# Patient Record
Sex: Female | Born: 1950 | Race: White | Hispanic: No | Smoking: Never smoker
Health system: Southern US, Community
[De-identification: ages and names within clinical notes are randomized; demographics above are authoritative.]

## PROBLEM LIST (undated history)

## (undated) DIAGNOSIS — E669 Obesity, unspecified: Principal | ICD-10-CM

## (undated) DIAGNOSIS — E079 Disorder of thyroid, unspecified: Secondary | ICD-10-CM

## (undated) DIAGNOSIS — T7840XA Allergy, unspecified, initial encounter: Secondary | ICD-10-CM

## (undated) DIAGNOSIS — L659 Nonscarring hair loss, unspecified: Secondary | ICD-10-CM

## (undated) DIAGNOSIS — C801 Malignant (primary) neoplasm, unspecified: Secondary | ICD-10-CM

## (undated) DIAGNOSIS — Z0000 Encounter for general adult medical examination without abnormal findings: Secondary | ICD-10-CM

## (undated) HISTORY — PX: TONSILLECTOMY: SHX5217

## (undated) HISTORY — PX: INNER EAR SURGERY: SHX679

## (undated) HISTORY — DX: Disorder of thyroid, unspecified: E07.9

## (undated) HISTORY — DX: Nonscarring hair loss, unspecified: L65.9

## (undated) HISTORY — DX: Allergy, unspecified, initial encounter: T78.40XA

## (undated) HISTORY — DX: Encounter for general adult medical examination without abnormal findings: Z00.00

## (undated) HISTORY — DX: Obesity, unspecified: E66.9

## (undated) HISTORY — DX: Malignant (primary) neoplasm, unspecified: C80.1

---

## 2009-10-14 ENCOUNTER — Ambulatory Visit: Payer: Self-pay | Admitting: Diagnostic Radiology

## 2009-10-14 ENCOUNTER — Inpatient Hospital Stay (HOSPITAL_COMMUNITY): Admission: AD | Admit: 2009-10-14 | Discharge: 2009-10-17 | Payer: Self-pay | Admitting: Internal Medicine

## 2009-10-14 ENCOUNTER — Ambulatory Visit: Payer: Self-pay | Admitting: Gastroenterology

## 2009-10-14 ENCOUNTER — Encounter: Payer: Self-pay | Admitting: Emergency Medicine

## 2009-10-26 ENCOUNTER — Encounter: Payer: Self-pay | Admitting: Internal Medicine

## 2009-10-26 LAB — HM COLONOSCOPY: HM Colonoscopy: ABNORMAL

## 2009-10-31 ENCOUNTER — Ambulatory Visit: Payer: Self-pay | Admitting: Oncology

## 2009-11-01 ENCOUNTER — Ambulatory Visit: Payer: Self-pay | Admitting: Internal Medicine

## 2009-11-01 DIAGNOSIS — E039 Hypothyroidism, unspecified: Secondary | ICD-10-CM | POA: Insufficient documentation

## 2009-11-01 DIAGNOSIS — E1169 Type 2 diabetes mellitus with other specified complication: Secondary | ICD-10-CM | POA: Insufficient documentation

## 2009-11-01 DIAGNOSIS — E669 Obesity, unspecified: Secondary | ICD-10-CM

## 2009-11-01 DIAGNOSIS — C189 Malignant neoplasm of colon, unspecified: Secondary | ICD-10-CM | POA: Insufficient documentation

## 2009-11-01 LAB — CONVERTED CEMR LAB

## 2009-11-04 ENCOUNTER — Encounter (INDEPENDENT_AMBULATORY_CARE_PROVIDER_SITE_OTHER): Payer: Self-pay | Admitting: General Surgery

## 2009-11-04 ENCOUNTER — Inpatient Hospital Stay (HOSPITAL_COMMUNITY): Admission: RE | Admit: 2009-11-04 | Discharge: 2009-11-08 | Payer: Self-pay | Admitting: General Surgery

## 2009-11-21 ENCOUNTER — Encounter: Payer: Self-pay | Admitting: Internal Medicine

## 2009-11-21 ENCOUNTER — Telehealth: Payer: Self-pay | Admitting: Internal Medicine

## 2009-11-23 ENCOUNTER — Encounter: Payer: Self-pay | Admitting: Internal Medicine

## 2009-12-05 ENCOUNTER — Ambulatory Visit: Payer: Self-pay | Admitting: Oncology

## 2009-12-07 ENCOUNTER — Ambulatory Visit (HOSPITAL_BASED_OUTPATIENT_CLINIC_OR_DEPARTMENT_OTHER): Admission: RE | Admit: 2009-12-07 | Discharge: 2009-12-07 | Payer: Self-pay | Admitting: General Surgery

## 2009-12-08 ENCOUNTER — Ambulatory Visit (HOSPITAL_COMMUNITY): Admission: RE | Admit: 2009-12-08 | Discharge: 2009-12-08 | Payer: Self-pay | Admitting: Oncology

## 2009-12-08 LAB — CBC WITH DIFFERENTIAL/PLATELET
Basophils Absolute: 0 10*3/uL (ref 0.0–0.1)
EOS%: 0.1 % (ref 0.0–7.0)
HGB: 10.6 g/dL — ABNORMAL LOW (ref 11.6–15.9)
MCH: 25.8 pg (ref 25.1–34.0)
MCV: 81.8 fL (ref 79.5–101.0)
MONO%: 9 % (ref 0.0–14.0)
NEUT%: 74.6 % (ref 38.4–76.8)
RDW: 18 % — ABNORMAL HIGH (ref 11.2–14.5)

## 2009-12-08 LAB — COMPREHENSIVE METABOLIC PANEL
ALT: 14 U/L (ref 0–35)
AST: 20 U/L (ref 0–37)
Alkaline Phosphatase: 54 U/L (ref 39–117)
Sodium: 135 mEq/L (ref 135–145)
Total Bilirubin: 0.6 mg/dL (ref 0.3–1.2)
Total Protein: 7.4 g/dL (ref 6.0–8.3)

## 2009-12-12 ENCOUNTER — Encounter: Payer: Self-pay | Admitting: Internal Medicine

## 2009-12-12 LAB — CBC WITH DIFFERENTIAL/PLATELET
BASO%: 0.4 % (ref 0.0–2.0)
HCT: 37 % (ref 34.8–46.6)
LYMPH%: 28.7 % (ref 14.0–49.7)
MCH: 25.9 pg (ref 25.1–34.0)
MCHC: 31.6 g/dL (ref 31.5–36.0)
MONO#: 0.4 10*3/uL (ref 0.1–0.9)
NEUT%: 59.3 % (ref 38.4–76.8)
Platelets: 259 10*3/uL (ref 145–400)

## 2010-01-02 ENCOUNTER — Ambulatory Visit: Payer: Self-pay | Admitting: Oncology

## 2010-01-03 ENCOUNTER — Encounter: Payer: Self-pay | Admitting: Internal Medicine

## 2010-01-03 LAB — COMPREHENSIVE METABOLIC PANEL
Albumin: 3.8 g/dL (ref 3.5–5.2)
CO2: 29 mEq/L (ref 19–32)
Calcium: 9 mg/dL (ref 8.4–10.5)
Glucose, Bld: 201 mg/dL — ABNORMAL HIGH (ref 70–99)
Potassium: 3.9 mEq/L (ref 3.5–5.3)
Sodium: 137 mEq/L (ref 135–145)
Total Protein: 7.1 g/dL (ref 6.0–8.3)

## 2010-01-03 LAB — CBC WITH DIFFERENTIAL/PLATELET
Eosinophils Absolute: 0.1 10*3/uL (ref 0.0–0.5)
HGB: 11.8 g/dL (ref 11.6–15.9)
MONO#: 0.4 10*3/uL (ref 0.1–0.9)
NEUT#: 1.4 10*3/uL — ABNORMAL LOW (ref 1.5–6.5)
Platelets: 263 10*3/uL (ref 145–400)
RBC: 4.24 10*6/uL (ref 3.70–5.45)
RDW: 20.2 % — ABNORMAL HIGH (ref 11.2–14.5)
WBC: 3.1 10*3/uL — ABNORMAL LOW (ref 3.9–10.3)

## 2010-01-04 LAB — RESEARCH LABS

## 2010-01-17 LAB — COMPREHENSIVE METABOLIC PANEL
ALT: 14 U/L (ref 0–35)
CO2: 29 mEq/L (ref 19–32)
Chloride: 100 mEq/L (ref 96–112)
Sodium: 137 mEq/L (ref 135–145)
Total Bilirubin: 0.6 mg/dL (ref 0.3–1.2)
Total Protein: 7.3 g/dL (ref 6.0–8.3)

## 2010-01-17 LAB — CBC WITH DIFFERENTIAL/PLATELET
BASO%: 0.3 % (ref 0.0–2.0)
LYMPH%: 16 % (ref 14.0–49.7)
MCHC: 33.7 g/dL (ref 31.5–36.0)
MONO#: 0.5 10*3/uL (ref 0.1–0.9)
RBC: 4.43 10*6/uL (ref 3.70–5.45)
WBC: 9.9 10*3/uL (ref 3.9–10.3)
lymph#: 1.6 10*3/uL (ref 0.9–3.3)

## 2010-01-18 LAB — RESEARCH LABS

## 2010-01-31 LAB — CBC WITH DIFFERENTIAL/PLATELET
Basophils Absolute: 0 10*3/uL (ref 0.0–0.1)
Eosinophils Absolute: 0.1 10*3/uL (ref 0.0–0.5)
HGB: 10.9 g/dL — ABNORMAL LOW (ref 11.6–15.9)
MCV: 83.8 fL (ref 79.5–101.0)
MONO#: 0.3 10*3/uL (ref 0.1–0.9)
MONO%: 13.6 % (ref 0.0–14.0)
NEUT#: 0.8 10*3/uL — ABNORMAL LOW (ref 1.5–6.5)
Platelets: 148 10*3/uL (ref 145–400)
RDW: 19.7 % — ABNORMAL HIGH (ref 11.2–14.5)
WBC: 2.2 10*3/uL — ABNORMAL LOW (ref 3.9–10.3)

## 2010-01-31 LAB — COMPREHENSIVE METABOLIC PANEL
Albumin: 3.6 g/dL (ref 3.5–5.2)
Alkaline Phosphatase: 87 U/L (ref 39–117)
BUN: 11 mg/dL (ref 6–23)
Glucose, Bld: 170 mg/dL — ABNORMAL HIGH (ref 70–99)
Total Bilirubin: 0.9 mg/dL (ref 0.3–1.2)

## 2010-02-03 ENCOUNTER — Ambulatory Visit: Payer: Self-pay | Admitting: Internal Medicine

## 2010-02-03 ENCOUNTER — Ambulatory Visit: Payer: Self-pay | Admitting: Oncology

## 2010-02-03 LAB — CONVERTED CEMR LAB
Albumin: 4.2 g/dL (ref 3.5–5.2)
Alkaline Phosphatase: 97 units/L (ref 39–117)
BUN: 12 mg/dL (ref 6–23)
Bilirubin, Direct: 0.1 mg/dL (ref 0.0–0.3)
CO2: 26 meq/L (ref 19–32)
Calcium: 9.6 mg/dL (ref 8.4–10.5)
Chloride: 101 meq/L (ref 96–112)
Creatinine, Ser: 0.74 mg/dL (ref 0.40–1.20)
Glucose, Bld: 114 mg/dL — ABNORMAL HIGH (ref 70–99)
HDL: 62 mg/dL (ref >39)
Indirect Bilirubin: 0.4 mg/dL (ref 0.0–0.9)
Total Bilirubin: 0.5 mg/dL (ref 0.3–1.2)

## 2010-02-06 ENCOUNTER — Telehealth: Payer: Self-pay | Admitting: Internal Medicine

## 2010-02-07 LAB — CBC WITH DIFFERENTIAL/PLATELET
Basophils Absolute: 0.1 10*3/uL (ref 0.0–0.1)
Eosinophils Absolute: 0.1 10*3/uL (ref 0.0–0.5)
HCT: 38.2 % (ref 34.8–46.6)
LYMPH%: 47.3 % (ref 14.0–49.7)
MCV: 85.1 fL (ref 79.5–101.0)
MONO#: 0.6 10*3/uL (ref 0.1–0.9)
MONO%: 18.1 % — ABNORMAL HIGH (ref 0.0–14.0)
NEUT#: 1 10*3/uL — ABNORMAL LOW (ref 1.5–6.5)
NEUT%: 29.2 % — ABNORMAL LOW (ref 38.4–76.8)
Platelets: 256 10*3/uL (ref 145–400)
RBC: 4.49 10*6/uL (ref 3.70–5.45)

## 2010-02-14 LAB — CBC WITH DIFFERENTIAL/PLATELET
BASO%: 1 % (ref 0.0–2.0)
EOS%: 1.2 % (ref 0.0–7.0)
HCT: 36.6 % (ref 34.8–46.6)
LYMPH%: 27.4 % (ref 14.0–49.7)
MCH: 29.1 pg (ref 25.1–34.0)
MCHC: 33.6 g/dL (ref 31.5–36.0)
MCV: 86.5 fL (ref 79.5–101.0)
MONO%: 10.3 % (ref 0.0–14.0)
NEUT%: 60.1 % (ref 38.4–76.8)
Platelets: 238 10*3/uL (ref 145–400)
RBC: 4.23 10*6/uL (ref 3.70–5.45)
WBC: 4.6 10*3/uL (ref 3.9–10.3)

## 2010-02-27 ENCOUNTER — Ambulatory Visit: Payer: Self-pay | Admitting: Oncology

## 2010-02-28 LAB — CBC WITH DIFFERENTIAL/PLATELET
Basophils Absolute: 0 10*3/uL (ref 0.0–0.1)
Eosinophils Absolute: 0.2 10*3/uL (ref 0.0–0.5)
HGB: 12.4 g/dL (ref 11.6–15.9)
MONO#: 0.4 10*3/uL (ref 0.1–0.9)
NEUT#: 7.8 10*3/uL — ABNORMAL HIGH (ref 1.5–6.5)
Platelets: 155 10*3/uL (ref 145–400)
RBC: 4.18 10*6/uL (ref 3.70–5.45)
RDW: 21.1 % — ABNORMAL HIGH (ref 11.2–14.5)
WBC: 9.9 10*3/uL (ref 3.9–10.3)

## 2010-02-28 LAB — COMPREHENSIVE METABOLIC PANEL
Albumin: 3.9 g/dL (ref 3.5–5.2)
CO2: 32 mEq/L (ref 19–32)
Calcium: 9.4 mg/dL (ref 8.4–10.5)
Chloride: 101 mEq/L (ref 96–112)
Glucose, Bld: 152 mg/dL — ABNORMAL HIGH (ref 70–99)
Potassium: 3.9 mEq/L (ref 3.5–5.3)
Sodium: 140 mEq/L (ref 135–145)
Total Protein: 7.2 g/dL (ref 6.0–8.3)

## 2010-03-03 ENCOUNTER — Ambulatory Visit: Payer: Self-pay | Admitting: Internal Medicine

## 2010-03-14 LAB — COMPREHENSIVE METABOLIC PANEL
Albumin: 4.4 g/dL (ref 3.5–5.2)
Alkaline Phosphatase: 144 U/L — ABNORMAL HIGH (ref 39–117)
Glucose, Bld: 194 mg/dL — ABNORMAL HIGH (ref 70–99)
Potassium: 3.8 mEq/L (ref 3.5–5.3)
Sodium: 140 mEq/L (ref 135–145)
Total Protein: 7.3 g/dL (ref 6.0–8.3)

## 2010-03-14 LAB — CBC WITH DIFFERENTIAL/PLATELET
Eosinophils Absolute: 0.1 10*3/uL (ref 0.0–0.5)
MCV: 89.7 fL (ref 79.5–101.0)
MONO#: 0.5 10*3/uL (ref 0.1–0.9)
MONO%: 3.3 % (ref 0.0–14.0)
NEUT#: 11.6 10*3/uL — ABNORMAL HIGH (ref 1.5–6.5)
RBC: 4.06 10*6/uL (ref 3.70–5.45)
RDW: 21.6 % — ABNORMAL HIGH (ref 11.2–14.5)
WBC: 13.7 10*3/uL — ABNORMAL HIGH (ref 3.9–10.3)
lymph#: 1.5 10*3/uL (ref 0.9–3.3)

## 2010-03-21 ENCOUNTER — Ambulatory Visit: Payer: Self-pay | Admitting: Genetic Counselor

## 2010-03-28 LAB — CBC WITH DIFFERENTIAL/PLATELET
Basophils Absolute: 0 10*3/uL (ref 0.0–0.1)
EOS%: 0.6 % (ref 0.0–7.0)
HCT: 35.1 % (ref 34.8–46.6)
HGB: 11.5 g/dL — ABNORMAL LOW (ref 11.6–15.9)
LYMPH%: 11.7 % — ABNORMAL LOW (ref 14.0–49.7)
MCH: 29.9 pg (ref 25.1–34.0)
MCV: 91.2 fL (ref 79.5–101.0)
MONO%: 6.3 % (ref 0.0–14.0)
NEUT%: 81.1 % — ABNORMAL HIGH (ref 38.4–76.8)
Platelets: 108 10*3/uL — ABNORMAL LOW (ref 145–400)

## 2010-03-28 LAB — COMPREHENSIVE METABOLIC PANEL
AST: 34 U/L (ref 0–37)
Albumin: 3.7 g/dL (ref 3.5–5.2)
Alkaline Phosphatase: 140 U/L — ABNORMAL HIGH (ref 39–117)
BUN: 9 mg/dL (ref 6–23)
Creatinine, Ser: 0.9 mg/dL (ref 0.40–1.20)
Potassium: 3.9 mEq/L (ref 3.5–5.3)

## 2010-03-30 ENCOUNTER — Ambulatory Visit: Payer: Self-pay | Admitting: Oncology

## 2010-04-11 ENCOUNTER — Encounter: Payer: Self-pay | Admitting: Internal Medicine

## 2010-04-11 LAB — CBC WITH DIFFERENTIAL/PLATELET
BASO%: 0.6 % (ref 0.0–2.0)
Eosinophils Absolute: 0.1 10*3/uL (ref 0.0–0.5)
MCHC: 32.9 g/dL (ref 31.5–36.0)
MONO#: 0.9 10*3/uL (ref 0.1–0.9)
NEUT#: 8.8 10*3/uL — ABNORMAL HIGH (ref 1.5–6.5)
RBC: 3.83 10*6/uL (ref 3.70–5.45)
WBC: 11.5 10*3/uL — ABNORMAL HIGH (ref 3.9–10.3)
lymph#: 1.7 10*3/uL (ref 0.9–3.3)

## 2010-04-11 LAB — COMPREHENSIVE METABOLIC PANEL
ALT: 16 U/L (ref 0–35)
CO2: 28 mEq/L (ref 19–32)
Calcium: 9.2 mg/dL (ref 8.4–10.5)
Chloride: 103 mEq/L (ref 96–112)
Creatinine, Ser: 0.7 mg/dL (ref 0.40–1.20)
Glucose, Bld: 131 mg/dL — ABNORMAL HIGH (ref 70–99)
Sodium: 140 mEq/L (ref 135–145)
Total Protein: 7.2 g/dL (ref 6.0–8.3)

## 2010-04-14 ENCOUNTER — Telehealth: Payer: Self-pay | Admitting: Internal Medicine

## 2010-04-25 ENCOUNTER — Encounter: Payer: Self-pay | Admitting: Internal Medicine

## 2010-04-25 LAB — COMPREHENSIVE METABOLIC PANEL
ALT: 16 U/L (ref 0–35)
AST: 27 U/L (ref 0–37)
Albumin: 3.6 g/dL (ref 3.5–5.2)
Alkaline Phosphatase: 147 U/L — ABNORMAL HIGH (ref 39–117)
Chloride: 103 mEq/L (ref 96–112)
Creatinine, Ser: 0.75 mg/dL (ref 0.40–1.20)
Potassium: 3.5 mEq/L (ref 3.5–5.3)

## 2010-04-25 LAB — CBC WITH DIFFERENTIAL/PLATELET
BASO%: 0.3 % (ref 0.0–2.0)
Basophils Absolute: 0 10*3/uL (ref 0.0–0.1)
Eosinophils Absolute: 0.1 10*3/uL (ref 0.0–0.5)
HCT: 35 % (ref 34.8–46.6)
HGB: 11.7 g/dL (ref 11.6–15.9)
MONO#: 0.5 10*3/uL (ref 0.1–0.9)
NEUT#: 8.5 10*3/uL — ABNORMAL HIGH (ref 1.5–6.5)
NEUT%: 82.5 % — ABNORMAL HIGH (ref 38.4–76.8)
WBC: 10.3 10*3/uL (ref 3.9–10.3)
lymph#: 1.1 10*3/uL (ref 0.9–3.3)

## 2010-04-28 ENCOUNTER — Ambulatory Visit: Payer: Self-pay | Admitting: Internal Medicine

## 2010-04-28 LAB — CONVERTED CEMR LAB
Chloride: 100 meq/L (ref 96–112)
Glucose, Bld: 113 mg/dL — ABNORMAL HIGH (ref 70–99)
Hgb A1c MFr Bld: 6.9 % — ABNORMAL HIGH (ref <5.7)
Potassium: 4.4 meq/L (ref 3.5–5.3)
Sodium: 139 meq/L (ref 135–145)
TSH: 8.051 microintl units/mL — ABNORMAL HIGH (ref 0.350–4.500)

## 2010-05-08 ENCOUNTER — Ambulatory Visit: Payer: Self-pay | Admitting: Oncology

## 2010-05-09 LAB — COMPREHENSIVE METABOLIC PANEL
AST: 29 U/L (ref 0–37)
Albumin: 3.7 g/dL (ref 3.5–5.2)
Alkaline Phosphatase: 142 U/L — ABNORMAL HIGH (ref 39–117)
BUN: 10 mg/dL (ref 6–23)
Glucose, Bld: 158 mg/dL — ABNORMAL HIGH (ref 70–99)
Potassium: 3.7 mEq/L (ref 3.5–5.3)
Sodium: 138 mEq/L (ref 135–145)
Total Bilirubin: 0.7 mg/dL (ref 0.3–1.2)

## 2010-05-09 LAB — CBC WITH DIFFERENTIAL/PLATELET
Basophils Absolute: 0 10*3/uL (ref 0.0–0.1)
EOS%: 1.5 % (ref 0.0–7.0)
Eosinophils Absolute: 0.2 10*3/uL (ref 0.0–0.5)
HGB: 11.7 g/dL (ref 11.6–15.9)
LYMPH%: 14.3 % (ref 14.0–49.7)
MCH: 32.2 pg (ref 25.1–34.0)
MCV: 97.8 fL (ref 79.5–101.0)
MONO%: 8.1 % (ref 0.0–14.0)
NEUT#: 8.1 10*3/uL — ABNORMAL HIGH (ref 1.5–6.5)
NEUT%: 75.7 % (ref 38.4–76.8)
Platelets: 100 10*3/uL — ABNORMAL LOW (ref 145–400)
RDW: 18.4 % — ABNORMAL HIGH (ref 11.2–14.5)

## 2010-05-12 ENCOUNTER — Ambulatory Visit: Payer: Self-pay | Admitting: Internal Medicine

## 2010-05-19 ENCOUNTER — Ambulatory Visit: Payer: Self-pay | Admitting: Internal Medicine

## 2010-05-19 DIAGNOSIS — L255 Unspecified contact dermatitis due to plants, except food: Secondary | ICD-10-CM | POA: Insufficient documentation

## 2010-05-23 LAB — CBC WITH DIFFERENTIAL/PLATELET
Basophils Absolute: 0 10*3/uL (ref 0.0–0.1)
Eosinophils Absolute: 0.1 10*3/uL (ref 0.0–0.5)
HGB: 11.4 g/dL — ABNORMAL LOW (ref 11.6–15.9)
LYMPH%: 37.1 % (ref 14.0–49.7)
MCV: 97.1 fL (ref 79.5–101.0)
MONO#: 0.4 10*3/uL (ref 0.1–0.9)
MONO%: 14.6 % — ABNORMAL HIGH (ref 0.0–14.0)
NEUT#: 1.3 10*3/uL — ABNORMAL LOW (ref 1.5–6.5)
Platelets: 133 10*3/uL — ABNORMAL LOW (ref 145–400)
RBC: 3.49 10*6/uL — ABNORMAL LOW (ref 3.70–5.45)
WBC: 2.9 10*3/uL — ABNORMAL LOW (ref 3.9–10.3)
nRBC: 0 % (ref 0–0)

## 2010-05-23 LAB — COMPREHENSIVE METABOLIC PANEL
Alkaline Phosphatase: 98 U/L (ref 39–117)
BUN: 12 mg/dL (ref 6–23)
CO2: 29 mEq/L (ref 19–32)
Creatinine, Ser: 0.72 mg/dL (ref 0.40–1.20)
Glucose, Bld: 158 mg/dL — ABNORMAL HIGH (ref 70–99)
Sodium: 140 mEq/L (ref 135–145)
Total Bilirubin: 1 mg/dL (ref 0.3–1.2)
Total Protein: 6.7 g/dL (ref 6.0–8.3)

## 2010-06-05 LAB — COMPREHENSIVE METABOLIC PANEL
ALT: 17 U/L (ref 0–35)
Albumin: 4 g/dL (ref 3.5–5.2)
Alkaline Phosphatase: 92 U/L (ref 39–117)
CO2: 27 mEq/L (ref 19–32)
Glucose, Bld: 130 mg/dL — ABNORMAL HIGH (ref 70–99)
Potassium: 3.9 mEq/L (ref 3.5–5.3)
Sodium: 139 mEq/L (ref 135–145)
Total Protein: 7.1 g/dL (ref 6.0–8.3)

## 2010-06-05 LAB — CBC WITH DIFFERENTIAL/PLATELET
BASO%: 1.2 % (ref 0.0–2.0)
Basophils Absolute: 0 10*3/uL (ref 0.0–0.1)
EOS%: 3.2 % (ref 0.0–7.0)
Eosinophils Absolute: 0.1 10*3/uL (ref 0.0–0.5)
HCT: 32.4 % — ABNORMAL LOW (ref 34.8–46.6)
HGB: 11.4 g/dL — ABNORMAL LOW (ref 11.6–15.9)
LYMPH%: 36.8 % (ref 14.0–49.7)
MCH: 34.7 pg — ABNORMAL HIGH (ref 25.1–34.0)
MCHC: 35.1 g/dL (ref 31.5–36.0)
MCV: 98.9 fL (ref 79.5–101.0)
MONO#: 0.5 10*3/uL (ref 0.1–0.9)
MONO%: 15.8 % — ABNORMAL HIGH (ref 0.0–14.0)
NEUT#: 1.3 10*3/uL — ABNORMAL LOW (ref 1.5–6.5)
NEUT%: 43 % (ref 38.4–76.8)
Platelets: 196 10*3/uL (ref 145–400)
RBC: 3.28 10*6/uL — ABNORMAL LOW (ref 3.70–5.45)
RDW: 17.6 % — ABNORMAL HIGH (ref 11.2–14.5)
WBC: 3 10*3/uL — ABNORMAL LOW (ref 3.9–10.3)
lymph#: 1.1 10*3/uL (ref 0.9–3.3)

## 2010-06-07 ENCOUNTER — Ambulatory Visit: Payer: Self-pay | Admitting: Oncology

## 2010-07-13 ENCOUNTER — Ambulatory Visit: Payer: Self-pay | Admitting: Oncology

## 2010-07-17 ENCOUNTER — Encounter: Payer: Self-pay | Admitting: Internal Medicine

## 2010-07-17 LAB — CBC WITH DIFFERENTIAL/PLATELET
BASO%: 0.7 % (ref 0.0–2.0)
EOS%: 2.6 % (ref 0.0–7.0)
MCH: 33.9 pg (ref 25.1–34.0)
MCHC: 34.9 g/dL (ref 31.5–36.0)
MCV: 97.1 fL (ref 79.5–101.0)
MONO%: 10.7 % (ref 0.0–14.0)
NEUT#: 2.3 10*3/uL (ref 1.5–6.5)
RBC: 3.65 10*6/uL — ABNORMAL LOW (ref 3.70–5.45)
RDW: 13.4 % (ref 11.2–14.5)

## 2010-08-17 ENCOUNTER — Ambulatory Visit: Payer: Self-pay | Admitting: Internal Medicine

## 2010-08-24 ENCOUNTER — Ambulatory Visit: Payer: Self-pay | Admitting: Oncology

## 2010-08-28 LAB — BASIC METABOLIC PANEL
BUN: 12 mg/dL (ref 6–23)
CO2: 29 mEq/L (ref 19–32)
Calcium: 9.2 mg/dL (ref 8.4–10.5)
Creatinine, Ser: 0.64 mg/dL (ref 0.40–1.20)

## 2010-10-02 ENCOUNTER — Ambulatory Visit (HOSPITAL_COMMUNITY): Admission: RE | Admit: 2010-10-02 | Discharge: 2010-10-02 | Payer: Self-pay | Admitting: Oncology

## 2010-10-12 ENCOUNTER — Ambulatory Visit: Payer: Self-pay | Admitting: Oncology

## 2010-10-16 ENCOUNTER — Encounter: Payer: Self-pay | Admitting: Internal Medicine

## 2010-11-22 ENCOUNTER — Ambulatory Visit (HOSPITAL_BASED_OUTPATIENT_CLINIC_OR_DEPARTMENT_OTHER): Admission: RE | Admit: 2010-11-22 | Discharge: 2010-11-22 | Payer: Self-pay | Admitting: General Surgery

## 2010-12-05 ENCOUNTER — Ambulatory Visit: Payer: Self-pay | Admitting: Internal Medicine

## 2010-12-05 LAB — HM MAMMOGRAPHY

## 2011-01-19 ENCOUNTER — Ambulatory Visit (HOSPITAL_COMMUNITY): Admission: RE | Admit: 2011-01-19 | Payer: Self-pay | Source: Home / Self Care | Admitting: Oncology

## 2011-01-30 NOTE — Progress Notes (Signed)
  Phone Note Outgoing Call   Summary of Call: call pt -  blood test shows she needs higher dose of thyroid replacement.   It also shows diabetes is uncontrolled.  I suggest she start metformin -  see rx.  I sugget earlier f/u appt within 1 month Initial call taken by: D. Thomos Lemons DO,  February 06, 2010 2:28 PM    New/Updated Medications: LEVOTHYROXINE SODIUM 50 MCG TABS (LEVOTHYROXINE SODIUM) one by mouth once daily METFORMIN HCL 500 MG TABS (METFORMIN HCL) one half tab by mouth two times a day x 1 week, then one by mouth two times a day Prescriptions: METFORMIN HCL 500 MG TABS (METFORMIN HCL) one half tab by mouth two times a day x 1 week, then one by mouth two times a day  #60 x 1   Entered and Authorized by:   D. Thomos Lemons DO   Signed by:   D. Thomos Lemons DO on 02/06/2010   Method used:   Electronically to        [facility] (retail)       [phone] [address]       [place], [place]  [address]       Ph: [phone]       Fax: [phone]   RxID:   [id number] LEVOTHYROXINE SODIUM 50 MCG TABS (LEVOTHYROXINE SODIUM) one by mouth once daily  #30 x 1   Entered and Authorized by:   D. Thomos Lemons DO   Signed by:   D. Thomos Lemons DO on 02/06/2010   Method used:   Electronically to        [facility] (retail)       [phone] [address]. [address]       [place], [place]  [address]       Ph: [phone]       Fax: [phone]   RxID:   [id number]

## 2011-01-30 NOTE — Assessment & Plan Note (Signed)
Summary: med refill/mhf   Vital Signs:  Patient profile:   60 year old female Height:      63 inches Weight:      207.50 pounds BMI:     36.89 O2 Sat:      99 % on Room air Temp:     98.1 degrees F oral Pulse rate:   70 / minute Resp:     16 per minute BP sitting:   120 / 72  (right arm) Cuff size:   large  Vitals Entered By: Glendell Docker CMA (December 05, 2010 4:22 PM)  O2 Flow:  Room air  Contraindications/Deferment of Procedures/Staging:    Test/Procedure: Mammogram    Reason for deferment: patient declined  CC: Medication Refill Is Patient Diabetic? Yes Did you bring your meter with you today? No Pain Assessment Patient in pain? no      Comments medication refill on Thyroid, last dose was 3 weeks ago, patient states she stopped taking Metformin, but can not remember when, she has not been checking her blood sugars     Last PAP Result Declined   Primary Care Provider:  Dondra Spry DO  CC:  Medication Refill.  History of Present Illness: 60 y/o white female with hx of colon ca, DM II and hypothyroidism for f/u pt stopped metformin poor dietary compliance not checking her blood sugars she reports financial difficulty   she ran out of thyroid medication she was instructed to f/u for blood work but never returned  Preventive Screening-Counseling & Management  Alcohol-Tobacco     Smoking Status: never  Allergies: 1)  ! Codeine  Past History:  Past Surgical History: Caesarean section x3 Tonsillectomy   Right Ear surgery due to numerous ear infections    Social History: Occupation:- Youth worker for local school   Widow/Widower 3 children    Smoking Status:  never  Physical Exam  General:  alert, well-developed, and well-nourished.   Lungs:  normal respiratory effort and normal breath sounds.   Heart:  normal rate, regular rhythm, no murmur, and no gallop.   Extremities:  trace left pedal edema and trace right pedal edema.      Impression & Recommendations:  Problem # 1:  DIABETES-TYPE 2 (ICD-250.00)  pt non compliant with medication.  she is not checking her blood sugars poor dietary compliance restart metformin We discussed risks assoc with untreated diabetes  The following medications were removed from the medication list:    Metformin Hcl 500 Mg Tabs (Metformin hcl) ..... One half tab by mouth two times a day x 1 week, then one by mouth two times a day Her updated medication list for this problem includes:    Metformin Hcl 500 Mg Tabs (Metformin hcl) ..... One by mouth bid  Orders: T-Basic Metabolic Panel [phone]) T- Hemoglobin A1C ([id number]) T-Urine Microalbumin w/creat. ratio [id number]) T-Lipid Profile [id number]) T-Hepatic Function [phone])  Labs Reviewed: Creat: 0.72 (04/28/2010)    Reviewed HgBA1c results: 6.9 (04/28/2010)  7.7 (02/03/2010)  Problem # 2:  HYPOTHYROIDISM (ICD-244.9) pt not coming in for required blood tests I explained to pt that TSH needs to be monitored for propor medication adjustment  Her updated medication list for this problem includes:    Levothyroxine Sodium 88 Mcg Tabs (Levothyroxine sodium) ..... One by mouth once daily  Orders: T-TSH ([id number])  Problem # 3:  COLON CANCER (ICD-153.9) followed by Dr. 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 One by mouth once daily 2)  Centrum Silver Tabs (Multiple vitamins-minerals) .... Take 1 tablet by mouth once a day 3)  Accu-chek Aviva Strp (Glucose blood) .... Test daily in am before meal 4)  Accu-chek Multiclix Lancets Misc (Lancets) .... Use as directed 5)  Metformin Hcl 500 Mg Tabs (Metformin hcl) .... One by mouth bid  Patient  Instructions: 1)  Please schedule a follow-up appointment in 3 months. Prescriptions: METFORMIN HCL 500 MG TABS (METFORMIN HCL) one by mouth bid  #60 x 3   Entered and Authorized by:   D. Thomos Lemons DO   Signed by:   D. Thomos Lemons DO on 12/05/2010   Method used:   Electronically to        [facility] (retail)       [address]       [place], [place]  [address]       Ph: [phone]       Fax: [phone]   RxID:   [id number] LEVOTHYROXINE SODIUM 88 MCG TABS (LEVOTHYROXINE SODIUM) one by mouth once daily  #30 x 3   Entered and Authorized by:   D. Thomos Lemons DO   Signed by:   D. Thomos Lemons DO on 12/05/2010   Method used:   Electronically to        [facility] (retail)       [address]       [place], [place]  [address]       Ph: [phone]       Fax: [phone]   RxID:   [id number] ACCU-CHEK MULTICLIX LANCETS  MISC (LANCETS) use as directed  #100 x 3   Entered and Authorized by:   D. Thomos Lemons DO   Signed by:   D. Thomos Lemons DO on 12/05/2010   Method used:   Electronically to        [facility] (retail)       [address]       [place], [place]  [address]       Ph: [phone]       Fax: [phone]   RxID:   [id number] ACCU-CHEK AVIVA  STRP (GLUCOSE BLOOD) test daily in AM before meal  #100 x 3   Entered and Authorized by:   D. Thomos Lemons DO   Signed by:   D. Thomos Lemons DO on 12/05/2010   Method used:   Electronically to        [facility] (retail)       [address]       [place], [place]  [address]       Ph: [phone]       Fax: [phone]   RxID:   [id number]    Orders Added: 1)  T-Basic Metabolic Panel [id number] 2)  T- Hemoglobin A1C [83036-23375] 3)  T-TSH [51025-85277] 4)  T-Urine Microalbumin w/creat. ratio [82043-[address]] 5)  T-Lipid Profile [80061-22930] 6)  T-Hepatic Function [80076-22960] 7)  Est. Patient Level III [[address]]      Preventive Care Screening  Mammogram:     Date:  12/05/2010    Results:  Declined  Pap Smear:    Date:  12/05/2010    Results:  Declined   Current Allergies (reviewed today): ! CODEINE

## 2011-01-30 NOTE — Assessment & Plan Note (Signed)
Summary: discuss thyroid & Diabetes- jr, resched- jr   Vital Signs:  Patient profile:   60 year old female Weight:      197 pounds BMI:     3.12 O2 Sat:      98 % on Room air Temp:     98.0 degrees F oral Pulse rate:   81 / minute Pulse rhythm:   regular Resp:     18 per minute BP sitting:   108 / 60  (right arm) Cuff size:   large  Vitals Entered By: Glendell Docker CMA (March 03, 2010 3:17 PM)  O2 Flow:  Room air CC: Follow-up visit to discuss lab results Is Patient Diabetic? Yes Comments discuss lab  no concerns, states low blood sugar was 90 and high 115 avg unknown   Primary Care Provider:  Dondra Spry DO  CC:  Follow-up visit to discuss lab results.  History of Present Illness: 60 y/o white female with hx of colon ca, DM II, and hypothyroidism for follow up/ She is still undergoing chemo feels nauseated and dizzy after getting neulasta we called in metformin but she has not started due to GI issues from chemo    Allergies: 1)  ! Codeine  Past History:  Past Medical History: Hypothyroidism DM II Right colon cancer  stage III (T3, N2b)    S/P right hemicolectomy, FOLFOX  - Dr. Myrle Sheng  Past Surgical History: Caesarean section x3 Tonsillectomy  Right Ear surgery due to numerous ear infections   Family History: Family History of Colon CA 1st degree relative <60 (father and brother) Family History Diabetes 1st degree relative Family History Hypertension Family History of Stroke F 1st degree relative <60     Social History: Occupation:- Youth worker for local school   Widow/Widower 3 children    Physical Exam  General:  alert, well-developed, and well-nourished.   Lungs:  normal respiratory effort and normal breath sounds.   Heart:  normal rate, regular rhythm, no murmur, and no gallop.   Extremities:  No lower extremity edema    Impression & Recommendations:  Problem # 1:  DIABETES-TYPE 2 (ICD-250.00) Due to nausea from chemo - hold  off starting metformin.    continue diet control  Her updated medication list for this problem includes:    Metformin Hcl 500 Mg Tabs (Metformin hcl) ..... One half tab by mouth two times a day x 1 week, then one by mouth two times a day  Labs Reviewed: Creat: 0.74 (02/03/2010)    Reviewed HgBA1c results: 7.7 (02/03/2010)  Problem # 2:  HYPOTHYROIDISM (ICD-244.9) thyroid dose increased to 50 micrograms.  repeast TSH in 2 months Her updated medication list for this problem includes:    Levothyroxine Sodium 50 Mcg Tabs (Levothyroxine sodium) ..... One by mouth qd  Labs Reviewed: TSH: 4.872 (02/03/2010)    HgBA1c: 7.7 (02/03/2010) Chol: 278 (02/03/2010)   HDL: 62 (02/03/2010)   LDL: See Comment mg/dL (16/10/9603)   TG: 540 (02/03/2010)  Complete Medication List: 1)  Levothyroxine Sodium 50 Mcg Tabs (Levothyroxine sodium) .... One by mouth qd 2)  Centrum Silver Tabs (Multiple vitamins-minerals) .... Take 1 tablet by mouth once a day 3)  Accu-chek Aviva Strp (Glucose blood) .... Test daily in am before meal 4)  Accu-chek Multiclix Lancet Dev Kit (Lancets misc.) .... Use as directed 5)  Metformin Hcl 500 Mg Tabs (Metformin hcl) .... One half tab by mouth two times a day x 1 week, then one by mouth  two times a day  Patient Instructions: 1)  Please schedule a follow-up appointment in 2 months. 2)  BMP prior to visit, ICD-9:  250.02 3)  TSH prior to visit, ICD-9: 244.90 4)  HbgA1C prior to visit, ICD-9: 250.02 5)  Please return for lab work one (1) week before your next appointment.  Prescriptions: LEVOTHYROXINE SODIUM 50 MCG TABS (LEVOTHYROXINE SODIUM) one by mouth qd  #30 x 3   Entered and Authorized by:   D. Thomos Lemons DO   Signed by:   D. Thomos Lemons DO on 03/03/2010   Method used:   Electronically to        [facility] (retail)       [phone] [address]       [place], [place]  [address]       Ph: [phone]       Fax: [phone]   RxID:    [id number] ACCU-CHEK MULTICLIX LANCET DEV  KIT (LANCETS MISC.) use as directed  #1 x 0   Entered and Authorized by:   D. Thomos Lemons DO   Signed by:   D. Thomos Lemons DO on 03/03/2010   Method used:   Electronically to        [facility] (retail)       [phone] [address]       [place], [place]  [address]       Ph: [phone]       Fax: [phone]   RxID:   [id number] LEVOTHYROXINE SODIUM 75 MCG TABS (LEVOTHYROXINE SODIUM) one by mouth at bedtime  #30 x 2   Entered and Authorized by:   D. Thomos Lemons DO   Signed by:   D. Thomos Lemons DO on 03/03/2010   Method used:   Electronically to        [facility] (retail)       [phone] [address]       [place], [place]  [address]       Ph: [phone]       Fax: [phone]   RxID:   [id number]   Current Allergies (reviewed today): ! CODEINE

## 2011-01-30 NOTE — Letter (Signed)
Summary: [facility]  [facility]   Imported By: Lanelle Bal 01/18/2010 11:35:52  _____________________________________________________________________  External Attachment:    Type:   Image     Comment:   External Document

## 2011-01-30 NOTE — Assessment & Plan Note (Signed)
Summary: poison ivy/mhf   Vital Signs:  Patient profile:   60 year old female Weight:      192 pounds BMI:     34.13 O2 Sat:      99 % on Room air Temp:     97.9 degrees F oral Pulse rate:   87 / minute Pulse rhythm:   regular Resp:     18 per minute BP sitting:   122 / 80  (right arm)  Vitals Entered By: Glendell Docker CMA (May 19, 2010 10:18 AM)  O2 Flow:  Room air CC: Rm 2- Rash   Primary Care Provider:  Dondra Spry DO  CC:  Rm 2- Rash.  History of Present Illness:  Rash      This is a 60 year old woman who presents with Rash.  The patient reports macules, but denies pustules and blisters.  The rash is located on the right leg and left leg.  The patient denies the following symptoms: facial swelling and tongue swelling.  The patient reports a history of new topical exposure.  she was recently trimming trees.  onset  sunday  Allergies: 1)  ! Codeine  Past History:  Past Medical History: Hypothyroidism DM II Right colon cancer  stage III (T3, N2b)    S/P right hemicolectomy, FOLFOX  - Dr. Myrle Sheng   Past Surgical History: Caesarean section x3 Tonsillectomy  Right Ear surgery due to numerous ear infections    Physical Exam  General:  alert, well-developed, and well-nourished.   Lungs:  normal respiratory effort and normal breath sounds.   Heart:  normal rate, regular rhythm, no murmur, and no gallop.   Skin:  scattered macular rash on arms and legs   Impression & Recommendations:  Problem # 1:  CONTACT DERMATITIS&OTHER ECZEMA DUE TO PLANTS (ICD-692.6) use topical steroids.  avoid prednisone due to DM II.  Patient advised to call office if symptoms persist or worsen.  Her updated medication list for this problem includes:    Triamcinolone Acetonide 0.5 % Crea (Triamcinolone acetonide) .Marland Kitchen... Apply two times a day x 1 week    Cetirizine Hcl 10 Mg Tabs (Cetirizine hcl) ..... One by mouth once daily  Complete Medication List: 1)  Levothyroxine Sodium 88 Mcg  Tabs (Levothyroxine sodium) .... One by mouth once daily 2)  Centrum Silver Tabs (Multiple vitamins-minerals) .... Take 1 tablet by mouth once a day 3)  Accu-chek Aviva Strp (Glucose blood) .... Test daily in am before meal 4)  Accu-chek Multiclix Lancet Dev Kit (Lancets misc.) .... Use as directed 5)  Metformin Hcl 500 Mg Tabs (Metformin hcl) .... One half tab by mouth two times a day x 1 week, then one by mouth two times a day 6)  Triamcinolone Acetonide 0.5 % Crea (Triamcinolone acetonide) .... Apply two times a day x 1 week 7)  Hydroxyzine Hcl 25 Mg Tabs (Hydroxyzine hcl) .... One by mouth at bedtime 8)  Cetirizine Hcl 10 Mg Tabs (Cetirizine hcl) .... One by mouth once daily  Patient Instructions: 1)  Call our office if your symptoms do not  improve or gets worse. Prescriptions: CETIRIZINE HCL 10 MG TABS (CETIRIZINE HCL) one by mouth once daily  #30 x 0   Entered and Authorized by:   D. Thomos Lemons DO   Signed by:   D. Thomos Lemons DO on 05/19/2010   Method used:   Electronically to        [facility] (retail)  [address]       [place], [place]  [address]       Ph: [phone]       Fax: [phone]   RxID:   [id number] HYDROXYZINE HCL 25 MG TABS (HYDROXYZINE HCL) one by mouth at bedtime  #30 x 0   Entered and Authorized by:   D. Thomos Lemons DO   Signed by:   D. Thomos Lemons DO on 05/19/2010   Method used:   Electronically to        [facility] (retail)       [address]       [place], [place]  [address]       Ph: [phone]       Fax: [phone]   RxID:   [id number] TRIAMCINOLONE ACETONIDE 0.5 % CREA (TRIAMCINOLONE ACETONIDE) apply two times a day x 1 week  #30 grams x 1   Entered and Authorized by:   D. Thomos Lemons DO   Signed by:   D. Thomos Lemons DO on 05/19/2010   Method used:   Electronically to        [facility] (retail)       [address]       [place], [place]  [address]       Ph: [phone]       Fax: [phone]    RxID:   [id number]   Current Allergies (reviewed today): ! CODEINE

## 2011-01-30 NOTE — Assessment & Plan Note (Signed)
Summary: med check/mhf   Vital Signs:  Patient profile:   60 year old female Weight:      198 pounds BMI:     35.20 O2 Sat:      99 % on Room air Temp:     97.8 degrees F oral Pulse rate:   66 / minute Pulse rhythm:   regular Resp:     16 per minute BP sitting:   100 / 70  (right arm) Cuff size:   large  Vitals Entered By: Glendell Docker CMA (February 03, 2010 3:23 PM)  O2 Flow:  Room air  Primary Care Provider:  D. Thomos Lemons DO  CC:  Medication Refill and Type 2 diabetes mellitus follow-up.  History of Present Illness: Medication Refill  Type 2 Diabetes Mellitus Follow-Up      This is a 60 year old woman who presents for Type 2 diabetes mellitus follow-up.  The patient denies polyuria and polydipsia.  The patient denies the following symptoms: chest pain.  Since the last visit the patient reports poor dietary compliance, not exercising regularly, and not monitoring blood glucose.    since prev visit, she had colon surgery and started chemo for right sided colon ca  Allergies (verified): 1)  ! Codeine  Past History:  Past Medical History: Hypothyroidism New onset DM II Right colon cancer   Past Surgical History: Caesarean section x3 Tonsillectomy  Right Ear surgery due to numerous ear infections  Family History: Family History of Colon CA 1st degree relative <60 (father and brother) Family History Diabetes 1st degree relative Family History Hypertension Family History of Stroke F 1st degree relative <60   Social History: Occupation:- Youth worker for local school   Widow/Widower 3 children   Physical Exam  General:  alert and overweight-appearing.   Neck:  supple and no masses.  no carotid bruits.   Lungs:  normal respiratory effort and normal breath sounds.   Heart:  normal rate, regular rhythm, no murmur, and no gallop.   Abdomen:  obese, soft, mild RLQ tenderness Extremities:  No lower extremity edema    Impression &  Recommendations:  Problem # 1:  DIABETES-TYPE 2 (ICD-250.00) Pt not checking her blood sugars. we provided glucometer.  we discussed starting metformin.  The following medications were removed from the medication list:    Glimepiride 1 Mg Tabs (Glimepiride) .Marland Kitchen... Take 1 tablet by mouth once a day Her updated medication list for this problem includes:    Metformin Hcl 500 Mg Tabs (Metformin hcl) ..... One half tab by mouth two times a day x 1 week, then one by mouth two times a day  Orders: T-Basic Metabolic Panel [phone]) T-Hepatic Function [phone]) T-Lipid Profile ([phone]) T- Hemoglobin A1C (284[address]-24401) T-Urine Microalbumin w/creat. ratio [phone]) Ophthalmology Referral (Ophthalmology)  Problem # 2:  HYPOTHYROIDISM (ICD-244.9) monitor TFTs Her updated medication list for this problem includes:    Levothyroxine Sodium 50 Mcg Tabs (Levothyroxine sodium) ..... One by mouth once daily  Orders: T-TSH (42595-63875)  Problem # 3:  COLON CANCER (ICD-153.9) Stage III adenoma of cecum (T3, N2b)  s/p right hemicolectomy 11/04/2009 receiving chemo FOLFOX - as per Dr. Truett Perna  Complete Medication List: 1)  Levothyroxine Sodium 50 Mcg Tabs (Levothyroxine sodium) .... One by mouth once daily 2)  Centrum Silver Tabs (Multiple vitamins-minerals) .... Take 1 tablet by mouth once a day 3)  Accu-chek Aviva Strp (Glucose blood) .... Test daily in am before meal 4)  Accu-chek Multiclix Lancets Misc (Lancets) .Marland KitchenMarland KitchenMarland Kitchen  Use daily as directed 5)  Metformin Hcl 500 Mg Tabs (Metformin hcl) .... One half tab by mouth two times a day x 1 week, then one by mouth two times a day  Patient Instructions: 1)  Please schedule a follow-up appointment in 2 months. Prescriptions: ACCU-CHEK MULTICLIX LANCETS  MISC (LANCETS) use daily as directed  #100 x 2   Entered and Authorized by:   D. Thomos Lemons DO   Signed by:   D. Thomos Lemons DO on 02/03/2010   Method used:   Electronically to         [facility] (retail)       [phone] [address]       [place], [place]  [address]       Ph: [phone]       Fax: [phone]   RxID:   [id number] ACCU-CHEK AVIVA  STRP (GLUCOSE BLOOD) test daily in AM before meal  #100 x 2   Entered and Authorized by:   D. Thomos Lemons DO   Signed by:   D. Thomos Lemons DO on 02/03/2010   Method used:   Electronically to        [facility] (retail)       [phone] [address]       [place], [place]  [address]       Ph: [phone]       Fax: [phone]   RxID:   [id number]   Current Allergies (reviewed today): ! CODEINE

## 2011-01-30 NOTE — Letter (Signed)
Summary: [facility]  [facility]   Imported By: Lanelle Bal 11/14/2010 13:19:14  _____________________________________________________________________  External Attachment:    Type:   Image     Comment:   External Document

## 2011-01-30 NOTE — Letter (Signed)
[facility]   Imported By: Lanelle Bal 02/03/2010 12:17:53  _____________________________________________________________________  External Attachment:    Type:   Image     Comment:   External Document

## 2011-01-30 NOTE — Letter (Signed)
Summary: [facility]  [facility]   Imported By: Lanelle Bal 01/18/2010 11:33:07  _____________________________________________________________________  External Attachment:    Type:   Image     Comment:   External Document

## 2011-01-30 NOTE — Progress Notes (Signed)
Summary: Levothyroxine Refill   Phone Note Refill Request Message from:  Fax from Pharmacy on April 14, 2010 9:43 AM  Refills Requested: Medication #1:  LEVOTHYROXINE SODIUM 50 MCG TABS one by mouth qd   Dosage confirmed as above?Dosage Confirmed   Supply Requested: 1 month [facility] [phone] fax [phone] phone  Next Appointment Scheduled: 4.29.11 8AM Initial call taken by: Lannette Donath,  April 14, 2010 9:44 AM  Follow-up for Phone Call        call placed to patient at [phone] female individual stated patient was not available. Message was left for patient to return call for rx clarification.  Refill was sent to [facility] on 3/4 and we have a request from [facility]. I need to know which pharmacy for the rx reuest. Follow-up by: Glendell Docker CMA,  April 14, 2010 12:22 PM  Additional Follow-up for Phone Call Additional follow up Details #1::        patient called back and wants her meds to [facility] Diane Tomerlin  April 14, 2010 1:30 PM    Additional Follow-up for Phone Call Additional follow up Details #2::    ok to refill x 3 Follow-up by: D. Thomos Lemons DO,  April 14, 2010 4:43 PM  Prescriptions: LEVOTHYROXINE SODIUM 50 MCG TABS (LEVOTHYROXINE SODIUM) one by mouth qd  #30 x 3   Entered by:   Mervin Kung CMA   Authorized by:   D. Thomos Lemons DO   Signed by:   Mervin Kung CMA on 04/14/2010   Method used:   Electronically to        [facility] (retail)       [address]       [place], [place]  [address]       Ph: [phone]       Fax: [phone]   RxID:   [id number]

## 2011-01-30 NOTE — Assessment & Plan Note (Signed)
Summary: 2 MONTH FOLLOW UP/MHF [facility] WITH PT/MHF   Vital Signs:  Patient profile:   60 year old female Height:      63 inches Weight:      191 pounds BMI:     33.96 O2 Sat:      99 % on Room air Temp:     97.9 degrees F oral Pulse rate:   69 / minute Pulse rhythm:   regular Resp:     16 per minute BP sitting:   104 / 60  (right arm) Cuff size:   large  Vitals Entered By: Glendell Docker CMA (May 12, 2010 9:32 AM)  O2 Flow:  Room air CC: Rm 3-2 Month Follow up disease management Comments not monitoring blood sugars, patient is not taking the Metformin   Primary Care Provider:  Dondra Spry DO  CC:  Rm 3-2 Month Follow up disease management.  History of Present Illness: 60 y/o white female for f/u 2 more sessions left for colon cancer following diabetic diet - mainly drinking water and diet drinks reviewed diet -  breakfast - 1 piece of toast, egg whites,  sometimes uses peanut butter lunch - thin bread, chicken sandwich dinner - sandwich, stir fried vegetables and meat,     Allergies: 1)  ! Codeine  Past History:  Past Medical History: Hypothyroidism DM II Right colon cancer  stage III (T3, N2b)    S/P right hemicolectomy, FOLFOX  - Dr. Myrle Sheng   Past Surgical History: Caesarean section x3 Tonsillectomy  Right Ear surgery due to numerous ear infections    Family History: Family History of Colon CA 1st degree relative <60 (father and brother) Family History Diabetes 1st degree relative Family History Hypertension Family History of Stroke F 1st degree relative <60      Social History: Occupation:- Youth worker for local school   Widow/Widower 3 children     Physical Exam  General:  alert, well-developed, and well-nourished.   Head:  alopecia Lungs:  normal respiratory effort and normal breath sounds.   Heart:  normal rate, regular rhythm, no murmur, and no gallop.   Extremities:  No lower extremity edema    Impression &  Recommendations:  Problem # 1:  HYPOTHYROIDISM (ICD-244.9) Pt reports taking thyroid replacement regularly.  unclear if chemo therapy affecting thyroid levels  Her updated medication list for this problem includes:    Levothyroxine Sodium 88 Mcg Tabs (Levothyroxine sodium) ..... One by mouth once daily  Labs Reviewed: TSH: 8.051 (04/28/2010)    HgBA1c: 6.9 (04/28/2010) Chol: 278 (02/03/2010)   HDL: 62 (02/03/2010)   LDL: See Comment mg/dL (63/87/5643)   TG: 329 (02/03/2010)  Problem # 2:  DIABETES-TYPE 2 (ICD-250.00) Less issues with nausea from chemo.  start metformin  Her updated medication list for this problem includes:    Metformin Hcl 500 Mg Tabs (Metformin hcl) ..... One half tab by mouth two times a day x 1 week, then one by mouth two times a day  Future Orders: T-Basic Metabolic Panel [id number]) ... 07/26/2010 T-Hepatic Function [id number]) ... 07/26/2010 T-Lipid Profile [id number]) ... 07/26/2010 T- Hemoglobin A1C ([id number]) ... 07/26/2010  Complete Medication List: 1)  Levothyroxine Sodium 88 Mcg Tabs (Levothyroxine sodium) .... One by mouth once daily 2)  Centrum Silver Tabs (Multiple vitamins-minerals) .... Take 1 tablet by mouth once a day 3)  Accu-chek Aviva Strp (Glucose blood) .... Test daily in am before meal 4)  Accu-chek Multiclix Lancet Dev Kit (Lancets misc.) .... Use as  directed 5)  Metformin Hcl 500 Mg Tabs (Metformin hcl) .... One half tab by mouth two times a day x 1 week, then one by mouth two times a day  Patient Instructions: 1)  Please schedule a follow-up appointment in 3 months. 2)  BMP prior to visit, ICD-9: 250.00 3)  Hepatic Panel prior to visit, ICD-9: 250.00 4)  Lipid Panel prior to visit, ICD-9: 250.00 5)  HbgA1C prior to visit, ICD-9: 250.00 6)  Please return for lab work one (1) week before your next appointment.  Prescriptions: METFORMIN HCL 500 MG TABS (METFORMIN HCL) one half tab by mouth two times a day x 1 week, then one  by mouth two times a day  #60 x 3   Entered and Authorized by:   D. Thomos Lemons DO   Signed by:   D. Thomos Lemons DO on 05/12/2010   Method used:   Electronically to        [facility]* (retail)       [address]       [place], [place]  [address]       Ph: [phone]       Fax: [phone]   RxID:   [id number] LEVOTHYROXINE SODIUM 88 MCG TABS (LEVOTHYROXINE SODIUM) one by mouth once daily  #30 x 3   Entered and Authorized by:   D. Thomos Lemons DO   Signed by:   D. Thomos Lemons DO on 05/12/2010   Method used:   Electronically to        [facility]* (retail)       [address]       [place], [place]  [address]       Ph: [phone]       Fax: [phone]   RxID:   [id number]   Current Allergies (reviewed today): ! CODEINE

## 2011-01-30 NOTE — Letter (Signed)
[facility]   Imported By: Lanelle Bal 07/28/2010 13:38:24  _____________________________________________________________________  External Attachment:    Type:   Image     Comment:   External Document

## 2011-01-31 ENCOUNTER — Encounter: Payer: Self-pay | Admitting: Oncology

## 2011-02-05 IMAGING — CT CT CHEST W/ CM
2 of 4 series · 16 of 46 positions shown, 18 images · IV contrast (omnipaque)
Comparison: CT 12/08/2009

CT CHEST

CLINICAL DATA: Colon cancer.  Chemotherapy complete.

CT CHEST, ABDOMEN AND PELVIS WITH CONTRAST
TECHNIQUE: Multidetector CT imaging of the chest, abdomen and
pelvis was performed following the standard protocol during bolus
administration of intravenous contrast.
Contrast: 100 ml Omnipaque 300

[Series 2: cap with st · axial · 0.76mm/px · z∈[+902,+1456]mm · 13 of 123 slices shown, 15 images]
[im 6/123  soft-tissue]
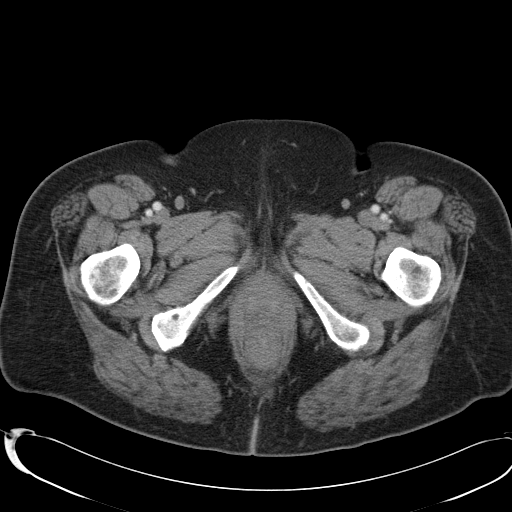
[im 6/123  bone]
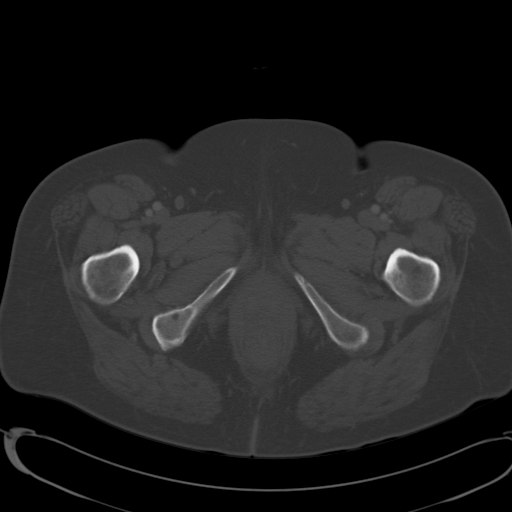
[im 16/123  soft-tissue]
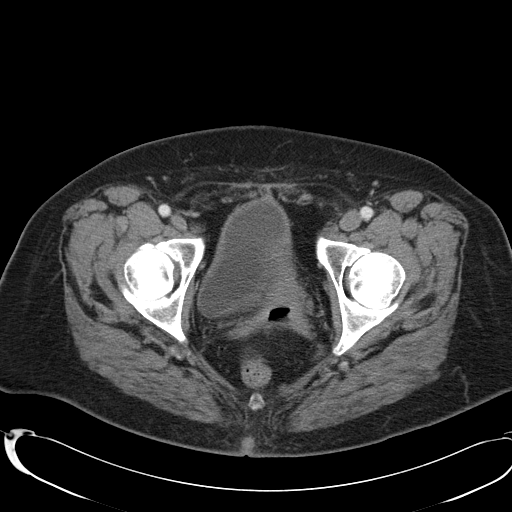
[im 26/123  soft-tissue]
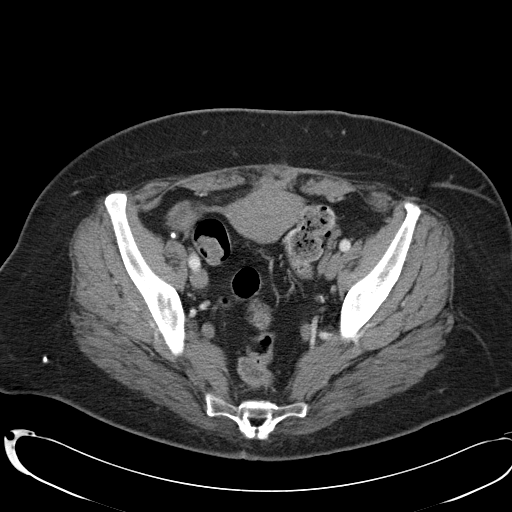
[im 36/123  soft-tissue]
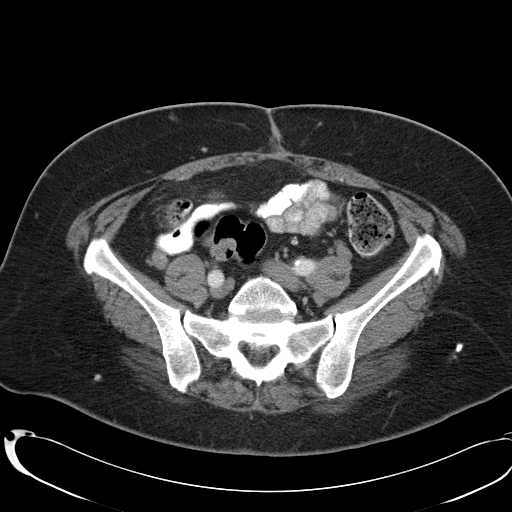
[im 41/123  soft-tissue]
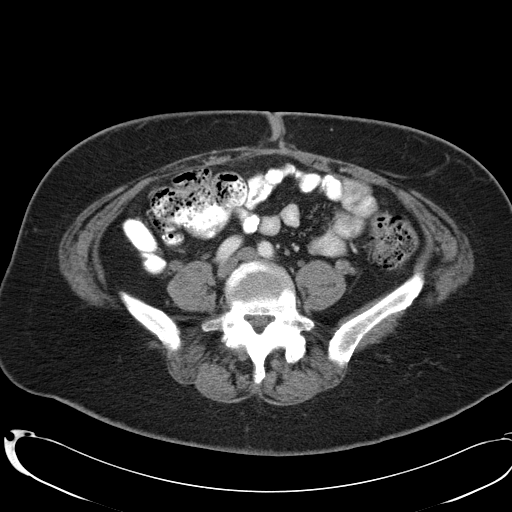
[im 51/123  soft-tissue]
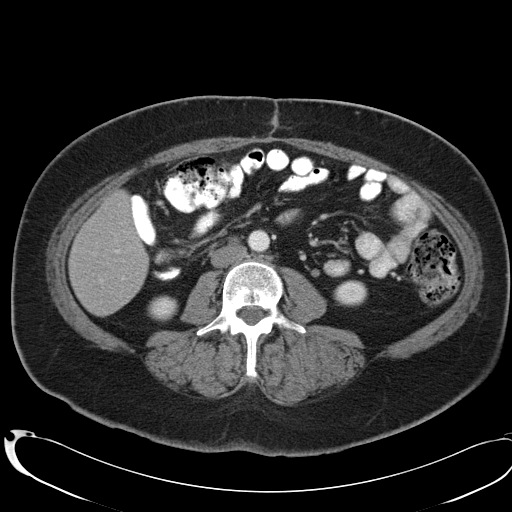
[im 62/123  soft-tissue]
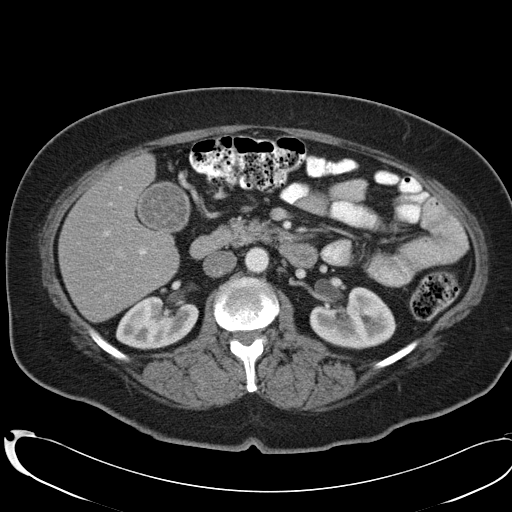
[im 72/123  soft-tissue]
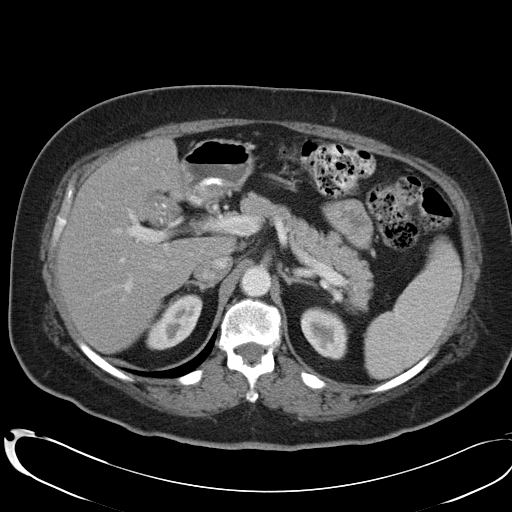
[im 82/123  soft-tissue]
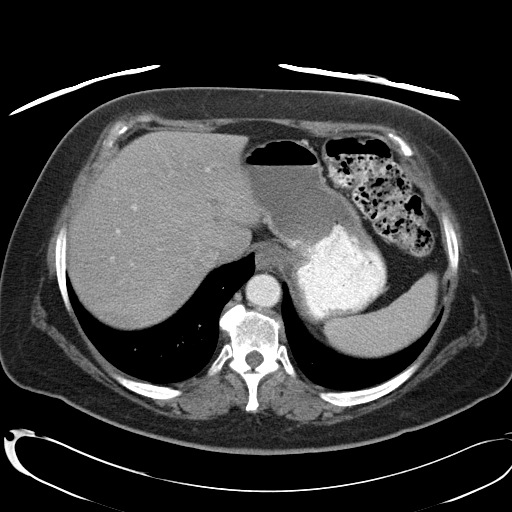
[im 82/123  bone]
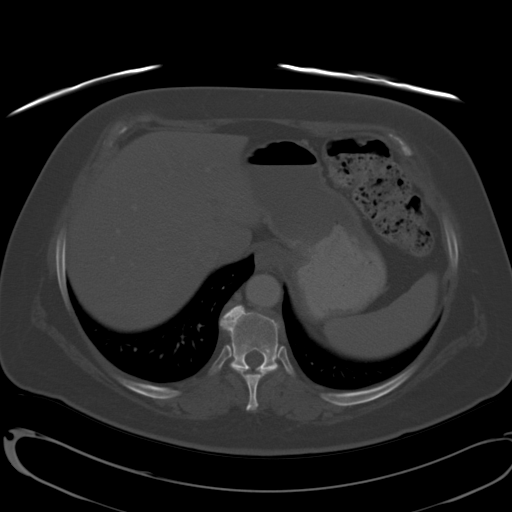
[im 87/123  soft-tissue]
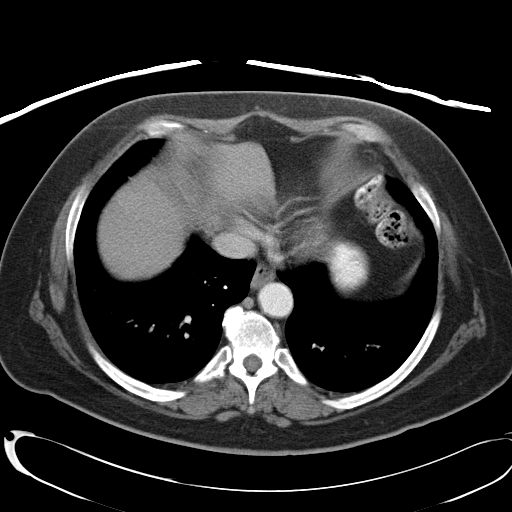
[im 97/123  soft-tissue]
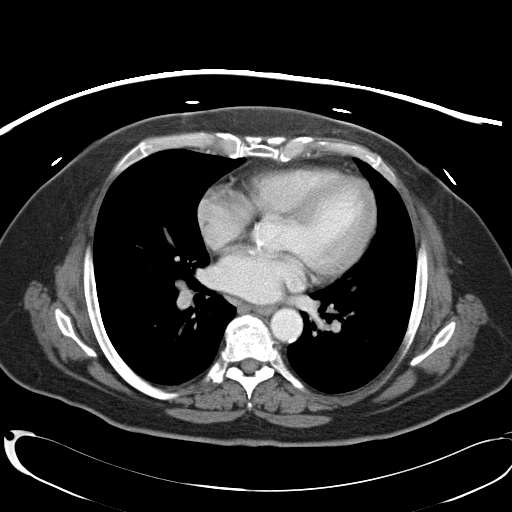
[im 107/123  soft-tissue]
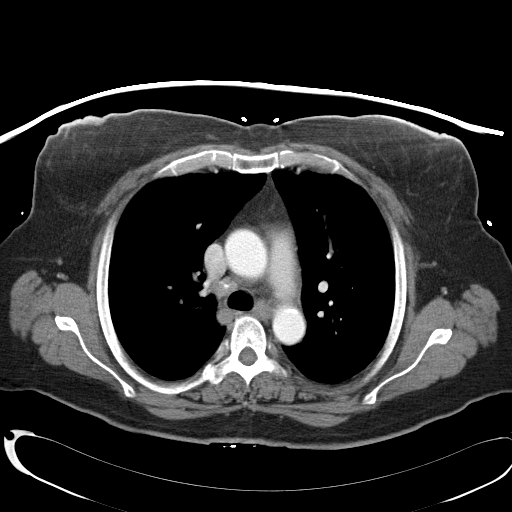
[im 117/123  soft-tissue]
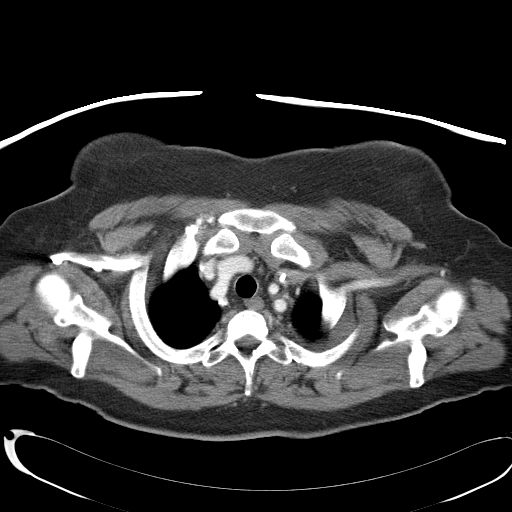

[Series 602: <mpr thick range> · coronal · 1.20mm/px · 3 of 91 slices shown]
[im 31/91  soft-tissue]
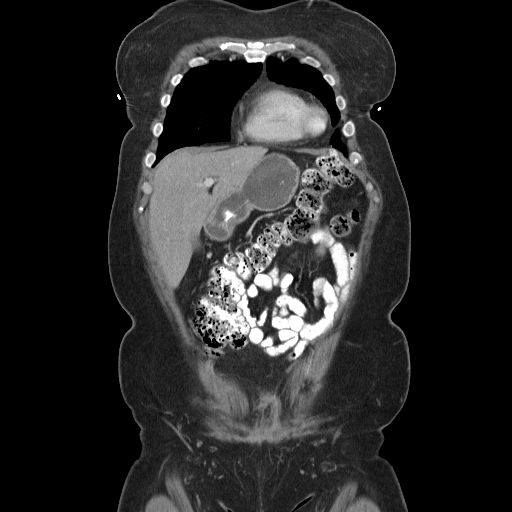
[im 41/91  soft-tissue]
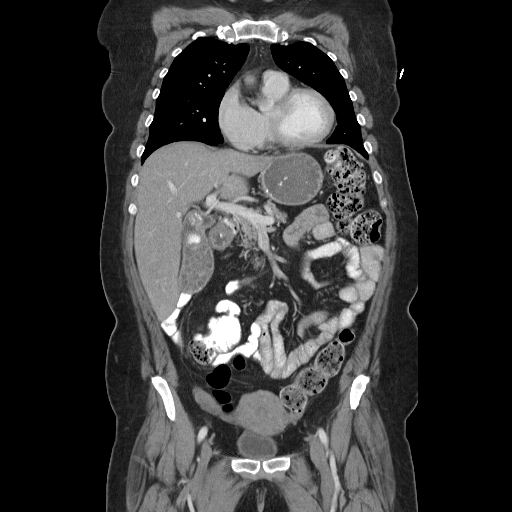
[im 51/91  soft-tissue]
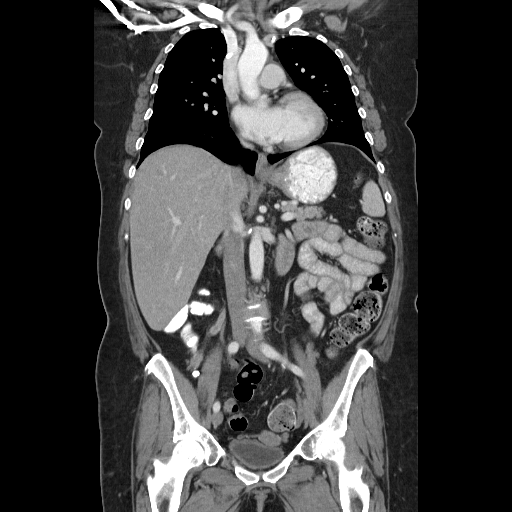

[16 of 46 positions shown; findings below may reference images not displayed]

FINDINGS: There is a port in the left anterior chest wall.  No
evidence of axillary or supraclavicular lymphadenopathy.  There is
small calcified paratracheal lymph nodes unchanged from prior.
There is a 10 mm right hilar lymph node which is unchanged from
prior (image 21).  No new adenopathy.

Review of the lung windows again demonstrated a right upper lobe
pulmonary nodule.  This nodule measures 7 mm compared to 5 mm on
prior (remeasured).  No additional pulmonary nodules evident.
IMPRESSION: Right upper lobe pulmonary nodule measures slightly larger than
prior (7 mm compared to 5 mm).  This could relate to the slice
selection; however cannot exclude an enlarging metastasis.  This
lesion is likely too small for accurate PET characterization
therefore recommend close interval CT follow-up (1 to 3 months).

CT ABDOMEN AND PELVIS
FINDINGS: No focal hepatic lesion.  Multiple gallstones in the
gallbladder.  Pancreas, spleen, adrenal glands, and kidneys are
normal.

The stomach, small bowel appear normal.  There is apparent partial
right hemicolectomy.  No evidence obstruction or nodularity at the
anastomoses.  The distal colon and rectosigmoid colon are normal.

Abdominal aorta is normal caliber.  No retroperitoneal
lymphadenopathy.

No free fluid the pelvis.  Uterus is normal.  There is a fatty
lesion within the myometrium which  appears benign.  No evidence of
pelvic lymphadenopathy. Review of  bone windows demonstrates no
aggressive osseous lesions.

9 mm right external iliac lymph node (image 109) is unchanged.
IMPRESSION: 1.  No evidence of local colon cancer recurrence or metastasis
within the abdomen pelvis.

2.  Stable small right iliac node.

## 2011-02-12 ENCOUNTER — Encounter (HOSPITAL_BASED_OUTPATIENT_CLINIC_OR_DEPARTMENT_OTHER): Payer: BC Managed Care – PPO | Admitting: Oncology

## 2011-02-12 ENCOUNTER — Encounter: Payer: Self-pay | Admitting: Internal Medicine

## 2011-02-12 DIAGNOSIS — J984 Other disorders of lung: Secondary | ICD-10-CM

## 2011-02-12 DIAGNOSIS — C180 Malignant neoplasm of cecum: Secondary | ICD-10-CM

## 2011-02-14 ENCOUNTER — Other Ambulatory Visit: Payer: Self-pay | Admitting: Oncology

## 2011-02-14 DIAGNOSIS — R918 Other nonspecific abnormal finding of lung field: Secondary | ICD-10-CM

## 2011-03-08 ENCOUNTER — Ambulatory Visit: Payer: Self-pay | Admitting: Internal Medicine

## 2011-03-08 DIAGNOSIS — Z0289 Encounter for other administrative examinations: Secondary | ICD-10-CM

## 2011-03-08 NOTE — Letter (Signed)
Summary: [facility]  [facility]   Imported By: Maryln Gottron 03/02/2011 15:10:52  _____________________________________________________________________  External Attachment:    Type:   Image     Comment:   External Document

## 2011-03-13 LAB — POCT HEMOGLOBIN-HEMACUE: Hemoglobin: 13.8 g/dL (ref 12.0–15.0)

## 2011-04-02 ENCOUNTER — Other Ambulatory Visit (HOSPITAL_COMMUNITY): Payer: BC Managed Care – PPO

## 2011-04-03 LAB — POCT I-STAT, CHEM 8
BUN: 10 mg/dL (ref 6–23)
Chloride: 103 mEq/L (ref 96–112)
Creatinine, Ser: 0.8 mg/dL (ref 0.4–1.2)
Sodium: 141 mEq/L (ref 135–145)

## 2011-04-03 LAB — POCT HEMOGLOBIN-HEMACUE: Hemoglobin: 12.6 g/dL (ref 12.0–15.0)

## 2011-04-03 LAB — GLUCOSE, CAPILLARY: Glucose-Capillary: 84 mg/dL (ref 70–99)

## 2011-04-04 LAB — BASIC METABOLIC PANEL
CO2: 29 mEq/L (ref 19–32)
Calcium: 8.1 mg/dL — ABNORMAL LOW (ref 8.4–10.5)
Calcium: 8.9 mg/dL (ref 8.4–10.5)
Creatinine, Ser: 0.66 mg/dL (ref 0.4–1.2)
GFR calc Af Amer: >60 mL/min (ref >60)
GFR calc Af Amer: >60 mL/min (ref >60)
GFR calc non Af Amer: >60 mL/min (ref >60)
GFR calc non Af Amer: >60 mL/min (ref >60)
Potassium: 4 mEq/L (ref 3.5–5.1)
Sodium: 135 mEq/L (ref 135–145)

## 2011-04-04 LAB — GLUCOSE, CAPILLARY
Glucose-Capillary: 104 mg/dL — ABNORMAL HIGH (ref 70–99)
Glucose-Capillary: 123 mg/dL — ABNORMAL HIGH (ref 70–99)
Glucose-Capillary: 125 mg/dL — ABNORMAL HIGH (ref 70–99)
Glucose-Capillary: 125 mg/dL — ABNORMAL HIGH (ref 70–99)
Glucose-Capillary: 131 mg/dL — ABNORMAL HIGH (ref 70–99)
Glucose-Capillary: 134 mg/dL — ABNORMAL HIGH (ref 70–99)
Glucose-Capillary: 135 mg/dL — ABNORMAL HIGH (ref 70–99)
Glucose-Capillary: 136 mg/dL — ABNORMAL HIGH (ref 70–99)
Glucose-Capillary: 177 mg/dL — ABNORMAL HIGH (ref 70–99)

## 2011-04-04 LAB — CBC
HCT: 35.2 % — ABNORMAL LOW (ref 36.0–46.0)
Hemoglobin: 11.7 g/dL — ABNORMAL LOW (ref 12.0–15.0)
MCHC: 33.1 g/dL (ref 30.0–36.0)
RBC: 3.72 MIL/uL — ABNORMAL LOW (ref 3.87–5.11)
RBC: 4.46 MIL/uL (ref 3.87–5.11)
RDW: 19.8 % — ABNORMAL HIGH (ref 11.5–15.5)
WBC: 4.7 10*3/uL (ref 4.0–10.5)

## 2011-04-05 LAB — CBC
HCT: 29.1 % — ABNORMAL LOW (ref 36.0–46.0)
HCT: 29.8 % — ABNORMAL LOW (ref 36.0–46.0)
HCT: 39 % (ref 36.0–46.0)
Hemoglobin: 12.4 g/dL (ref 12.0–15.0)
Hemoglobin: 9.9 g/dL — ABNORMAL LOW (ref 12.0–15.0)
MCHC: 31.8 g/dL (ref 30.0–36.0)
MCHC: 33.3 g/dL (ref 30.0–36.0)
MCV: 76.6 fL — ABNORMAL LOW (ref 78.0–100.0)
MCV: 77.5 fL — ABNORMAL LOW (ref 78.0–100.0)
Platelets: 256 10*3/uL (ref 150–400)
Platelets: 281 10*3/uL (ref 150–400)
Platelets: 309 10*3/uL (ref 150–400)
Platelets: 452 K/uL — ABNORMAL HIGH (ref 150–400)
RBC: 3.85 MIL/uL — ABNORMAL LOW (ref 3.87–5.11)
RBC: 5.09 MIL/uL (ref 3.87–5.11)
RDW: 14.8 % (ref 11.5–15.5)
RDW: 15.8 % — ABNORMAL HIGH (ref 11.5–15.5)
WBC: 10.6 K/uL — ABNORMAL HIGH (ref 4.0–10.5)
WBC: 5.9 10*3/uL (ref 4.0–10.5)

## 2011-04-05 LAB — DIFFERENTIAL
Basophils Absolute: 0 10*3/uL (ref 0.0–0.1)
Basophils Absolute: 0 K/uL (ref 0.0–0.1)
Basophils Relative: 0 % (ref 0–1)
Basophils Relative: 1 % (ref 0–1)
Eosinophils Absolute: 0.1 10*3/uL (ref 0.0–0.7)
Eosinophils Relative: 1 % (ref 0–5)
Eosinophils Relative: 3 % (ref 0–5)
Lymphocytes Relative: 13 % (ref 12–46)
Lymphs Abs: 1.4 10*3/uL (ref 0.7–4.0)
Monocytes Absolute: 0.6 10*3/uL (ref 0.1–1.0)
Monocytes Absolute: 0.6 10*3/uL (ref 0.1–1.0)
Monocytes Relative: 5 % (ref 3–12)
Neutro Abs: 3.5 10*3/uL (ref 1.7–7.7)
Neutro Abs: 8.5 K/uL — ABNORMAL HIGH (ref 1.7–7.7)
Neutrophils Relative %: 81 % — ABNORMAL HIGH (ref 43–77)

## 2011-04-05 LAB — HEMOCCULT GUIAC POC 1CARD (OFFICE): Fecal Occult Bld: NEGATIVE

## 2011-04-05 LAB — COMPREHENSIVE METABOLIC PANEL WITH GFR
BUN: 14 mg/dL (ref 6–23)
CO2: 25 meq/L (ref 19–32)
Calcium: 10.4 mg/dL (ref 8.4–10.5)
Chloride: 99 meq/L (ref 96–112)
Creatinine, Ser: 0.7 mg/dL (ref 0.4–1.2)
GFR calc non Af Amer: >60 mL/min (ref >60)
Total Bilirubin: 0.7 mg/dL (ref 0.3–1.2)

## 2011-04-05 LAB — VITAMIN D 1,25 DIHYDROXY: Vitamin D2 1, 25 (OH)2: <8 pg/mL

## 2011-04-05 LAB — COMPREHENSIVE METABOLIC PANEL
ALT: 18 U/L (ref 0–35)
AST: 16 U/L (ref 0–37)
AST: 27 U/L (ref 0–37)
Albumin: 3 g/dL — ABNORMAL LOW (ref 3.5–5.2)
Albumin: 5 g/dL (ref 3.5–5.2)
Alkaline Phosphatase: 131 U/L — ABNORMAL HIGH (ref 39–117)
Alkaline Phosphatase: 72 U/L (ref 39–117)
BUN: 9 mg/dL (ref 6–23)
Chloride: 101 mEq/L (ref 96–112)
GFR calc Af Amer: >60 mL/min (ref >60)
Glucose, Bld: 224 mg/dL — ABNORMAL HIGH (ref 70–99)
Potassium: 3.6 mEq/L (ref 3.5–5.1)
Potassium: 4.3 mEq/L (ref 3.5–5.1)
Sodium: 141 mEq/L (ref 135–145)
Total Bilirubin: 0.6 mg/dL (ref 0.3–1.2)
Total Protein: 9.1 g/dL — ABNORMAL HIGH (ref 6.0–8.3)

## 2011-04-05 LAB — LIPID PANEL
Total CHOL/HDL Ratio: 4.7 RATIO
VLDL: 27 mg/dL (ref 0–40)

## 2011-04-05 LAB — URINALYSIS, ROUTINE W REFLEX MICROSCOPIC
Bilirubin Urine: NEGATIVE
Glucose, UA: NEGATIVE mg/dL
Hgb urine dipstick: NEGATIVE
Ketones, ur: 15 mg/dL — AB
Nitrite: NEGATIVE
Protein, ur: NEGATIVE mg/dL
Specific Gravity, Urine: 1.021 (ref 1.005–1.030)
Urobilinogen, UA: 0.2 mg/dL (ref 0.0–1.0)
pH: 7.5 (ref 5.0–8.0)

## 2011-04-05 LAB — POCT CARDIAC MARKERS
CKMB, poc: 3.9 ng/mL (ref 1.0–8.0)
Myoglobin, poc: 54.4 ng/mL (ref 12–200)
Troponin i, poc: <0.05 ng/mL (ref 0.00–0.09)

## 2011-04-05 LAB — BASIC METABOLIC PANEL
BUN: 7 mg/dL (ref 6–23)
CO2: 30 mEq/L (ref 19–32)
Chloride: 103 mEq/L (ref 96–112)
Creatinine, Ser: 0.77 mg/dL (ref 0.4–1.2)

## 2011-04-05 LAB — HEMOGLOBIN A1C: Mean Plasma Glucose: 192 mg/dL

## 2011-04-05 LAB — FOLATE: Folate: >20 ng/mL

## 2011-04-05 LAB — GLUCOSE, CAPILLARY: Glucose-Capillary: 119 mg/dL — ABNORMAL HIGH (ref 70–99)

## 2011-04-05 LAB — LIPASE, BLOOD: Lipase: 71 U/L (ref 23–300)

## 2011-04-05 LAB — T4, FREE: Free T4: 0.73 ng/dL — ABNORMAL LOW (ref 0.80–1.80)

## 2011-04-05 LAB — RETICULOCYTES
Retic Count, Absolute: 50.2 10*3/uL (ref 19.0–186.0)
Retic Ct Pct: 1.2 % (ref 0.4–3.1)

## 2011-04-05 LAB — GIARDIA/CRYPTOSPORIDIUM SCREEN(EIA): Giardia Screen - EIA: NEGATIVE

## 2011-04-05 LAB — CLOSTRIDIUM DIFFICILE EIA

## 2011-06-13 ENCOUNTER — Encounter: Payer: Self-pay | Admitting: Internal Medicine

## 2011-06-14 ENCOUNTER — Encounter: Payer: Self-pay | Admitting: Family Medicine

## 2011-06-14 ENCOUNTER — Ambulatory Visit (INDEPENDENT_AMBULATORY_CARE_PROVIDER_SITE_OTHER): Payer: BC Managed Care – PPO | Admitting: Family Medicine

## 2011-06-14 DIAGNOSIS — E119 Type 2 diabetes mellitus without complications: Secondary | ICD-10-CM

## 2011-06-14 DIAGNOSIS — E039 Hypothyroidism, unspecified: Secondary | ICD-10-CM

## 2011-06-14 LAB — HEMOGLOBIN A1C: Mean Plasma Glucose: 157 mg/dL — ABNORMAL HIGH (ref <117)

## 2011-06-14 MED ORDER — LEVOTHYROXINE SODIUM 88 MCG PO TABS: 88.0000 ug | ORAL_TABLET | Freq: Every day | ORAL | Status: DC

## 2011-06-14 MED ORDER — METFORMIN HCL 500 MG PO TABS: 500.0000 mg | ORAL_TABLET | Freq: Two times a day (BID) | ORAL | Status: DC

## 2011-06-14 NOTE — Assessment & Plan Note (Signed)
Noncompliant with med RFs/approp f/u/lab testing/home gluc monitoring. RF'd metformin today, encouraged home gluc checks once daily. Check HbA1c today.  Last was 03/2010 and was 7.7%. She wants to defer referral for eye exam and her urine microalbumin testing until next f/u.

## 2011-06-14 NOTE — Assessment & Plan Note (Signed)
Noncompliant with med rfs/f/u/lab testing. Encouraged compliance. Check TSH today.

## 2011-06-14 NOTE — Progress Notes (Signed)
OFFICE NOTE  06/14/2011  CC:  Chief Complaint  Patient presents with  . Diabetes    not checking blood sugars-not taking metformin  . Hypothyroidism    request refill on medication     HPI:   Patient is a 60 y.o. Caucasian female who is here for DM 2 and hypothyroidism f/u. Feeling ok, no signif c/o. She notes fingernails brittle when she's run out of meds for a while, family notices increased irritability. Has been out of meds for a couple of months at least.  Doesn't check glucoses--doesn't sound like it is high on her priority list. Rarely exercises--walks some in the park.  No significant attempts at diabetic diet. This nice woman does not put her health first in her life. Works 2 jobs, busy with her grown children and her one grand child. Hx of colon ca, last f/u 12/2010 with Dr. Truett Perna, missed 04/2011 f/u and needs to reschedule. Says toes have felt cold since getting chemo, but denies tingling, numbness, or pain in toes or feet.  No hx of ulcers.  Says she has never had diab ret eye exam.  Pertinent PMH:  DM 2, history of noncompliance with meds and monitoring. Hypothyroidism: noncompliant with meds, monitoring Colon cancer 2010--hemicolectomy + chemo MEDS;   Outpatient Prescriptions Prior to Visit  Medication Sig Dispense Refill  . glucose blood (ACCU-CHEK AVIVA) test strip Use to test blood sugar before morning meal.       . Multiple Vitamins-Minerals (CENTRUM SILVER PO) Take 1 tablet by mouth daily.        . Lancets Misc. (ACCU-CHEK MULTICLIX LANCET DEV) KIT Use as directed       . levothyroxine (SYNTHROID, LEVOTHROID) 88 MCG tablet Take 88 mcg by mouth daily.        . metFORMIN (GLUCOPHAGE) 500 MG tablet Take 500 mg by mouth 2 (two) times daily with a meal.          PE: Blood pressure 124/64, pulse 68, temperature 98.2 F (36.8 C), temperature source Oral, resp. rate 18, height 5\' 3"  (1.6 m), weight 212 lb (96.163 kg), SpO2 99.00%. Gen: Alert, well appearing.   Patient is oriented to person, place, time, and situation. Chest: symmetric expansion, nonlabored respirations.  Clear and equal breath sounds in all lung fields.   CV: RRR, no m/r/g.  Peripheral pulses 2+ and symmetric.   IMPRESSION AND PLAN:  DIABETES-TYPE 2 Noncompliant with med RFs/approp f/u/lab testing/home gluc monitoring. RF'd metformin today, encouraged home gluc checks once daily. Check HbA1c today.  Last was 03/2010 and was 7.7%. She wants to defer referral for eye exam and her urine microalbumin testing until next f/u.  HYPOTHYROIDISM Noncompliant with med rfs/f/u/lab testing. Encouraged compliance. Check TSH today.   Colon cancer, s/p hemicolectomy and chemo.   Encouraged her to reschedule the last f/u she missed with her oncologist in April this year.  FOLLOW UP:  Return in about 5 months (around 11/14/2011).

## 2011-06-19 ENCOUNTER — Encounter: Payer: Self-pay | Admitting: *Deleted

## 2011-06-26 ENCOUNTER — Other Ambulatory Visit (HOSPITAL_COMMUNITY): Payer: BC Managed Care – PPO

## 2011-11-12 ENCOUNTER — Ambulatory Visit: Payer: BC Managed Care – PPO | Admitting: Internal Medicine

## 2011-11-20 ENCOUNTER — Ambulatory Visit (INDEPENDENT_AMBULATORY_CARE_PROVIDER_SITE_OTHER): Payer: BC Managed Care – PPO | Admitting: Internal Medicine

## 2011-11-20 ENCOUNTER — Telehealth: Payer: Self-pay | Admitting: Internal Medicine

## 2011-11-20 ENCOUNTER — Ambulatory Visit: Payer: BC Managed Care – PPO | Admitting: Internal Medicine

## 2011-11-20 ENCOUNTER — Encounter: Payer: Self-pay | Admitting: Internal Medicine

## 2011-11-20 DIAGNOSIS — E119 Type 2 diabetes mellitus without complications: Secondary | ICD-10-CM

## 2011-11-20 DIAGNOSIS — E039 Hypothyroidism, unspecified: Secondary | ICD-10-CM

## 2011-11-20 DIAGNOSIS — C189 Malignant neoplasm of colon, unspecified: Secondary | ICD-10-CM

## 2011-11-20 LAB — CBC
MCH: 30 pg (ref 26.0–34.0)
MCV: 90.5 fL (ref 78.0–100.0)
Platelets: 225 10*3/uL (ref 150–400)
RBC: 4.3 MIL/uL (ref 3.87–5.11)

## 2011-11-20 LAB — TSH: TSH: 1.025 u[IU]/mL (ref 0.350–4.500)

## 2011-11-20 LAB — BASIC METABOLIC PANEL
BUN: 20 mg/dL (ref 6–23)
CO2: 30 mEq/L (ref 19–32)
Calcium: 9.1 mg/dL (ref 8.4–10.5)
Creat: 0.79 mg/dL (ref 0.50–1.10)
Glucose, Bld: 116 mg/dL — ABNORMAL HIGH (ref 70–99)

## 2011-11-20 MED ORDER — LEVOTHYROXINE SODIUM 88 MCG PO TABS: 88.0000 ug | ORAL_TABLET | Freq: Every day | ORAL | Status: DC

## 2011-11-20 NOTE — Patient Instructions (Signed)
Please schedule chem7, a1c, lipid 250.0 prior to next visit 

## 2011-11-20 NOTE — Telephone Encounter (Signed)
Lab orders entered for February 2013. 

## 2011-11-21 LAB — MICROALBUMIN / CREATININE URINE RATIO: Microalb, Ur: 0.78 mg/dL (ref 0.00–1.89)

## 2011-11-24 NOTE — Assessment & Plan Note (Signed)
Reinforced compliance. Discussed potential complications of diabetes with indadequate control. Attempt metformin qd. Recommend diabetic eye exam which she currently defers. Prescription for test strips and lancets to monitor fsbs qd. Obtain cbc, chem7, a1c and urine microalbumin. Not currently fasting-obtain lipid profile prior to next visit

## 2011-11-24 NOTE — Assessment & Plan Note (Signed)
Stable. Obtain tsh

## 2011-11-24 NOTE — Assessment & Plan Note (Signed)
Schedule f/u with H/O

## 2011-11-24 NOTE — Progress Notes (Signed)
  Subjective:    Patient ID: Jasmine Morse, female    DOB: [birth year], 60 y.o.   MRN: [id number]  HPI Pt presents to clinic for followup of multiple medical problems. H/o colon ca and missed f/u with H/O. Not taking metformin regularly or checking fsbs. Sometimes feels light headed when takes metformin bid. No gi effects. Is taking thyroid medication now daily. Declines pap smear, zostavax and flu vaccine. No active complaint.  Past Medical History  Diagnosis Date  . Thyroid disease     hypothyroidism  . Diabetes mellitus     type II  . Cancer     colon cancer stage III (T3, N2b) s/p right hemicolectomy, FOLFOX-- Dr Myrle Sheng   Past Surgical History  Procedure Date  . Cesarean section     x 3  . Tonsillectomy   . Inner ear surgery     due to numerous ear infections    reports that she has never smoked. She has never used smokeless tobacco. She reports that she does not drink alcohol or use illicit drugs. family history includes Cancer in her brother and father; Diabetes in her other; Hypertension in her other; and Stroke in her other. Allergies  Allergen Reactions  . Codeine     REACTION: Nausea \\T \ Diarrhea     Review of Systems see hpi     Objective:   Physical Exam  Physical Exam  Nursing note and vitals reviewed. Constitutional: Appears well-developed and well-nourished. No distress.  HENT:  Head: Normocephalic and atraumatic.  Right Ear: External ear normal.  Left Ear: External ear normal.  Eyes: Conjunctivae are normal. No scleral icterus.  Neck: Neck supple. Carotid bruit is not present.  Cardiovascular: Normal rate, regular rhythm and normal heart sounds.  Exam reveals no gallop and no friction rub.   No murmur heard. Pulmonary/Chest: Effort normal and breath sounds normal. No respiratory distress. He has no wheezes. no rales.  Lymphadenopathy:    He has no cervical adenopathy.  Neurological:Alert.  Skin: Skin is warm and dry. Not diaphoretic.  Psychiatric: Has  a normal mood and affect.        Assessment & Plan:

## 2011-12-05 ENCOUNTER — Other Ambulatory Visit: Payer: Self-pay | Admitting: *Deleted

## 2011-12-05 DIAGNOSIS — C189 Malignant neoplasm of colon, unspecified: Secondary | ICD-10-CM

## 2011-12-10 ENCOUNTER — Telehealth: Payer: Self-pay | Admitting: Oncology

## 2011-12-10 NOTE — Telephone Encounter (Signed)
S/w the pt and she is aware of the lab/ct scan appt in dec and the md appt in jan. Pt is aware to stop by the scheduling desk to pick up the oral contrast along with the instructions.

## 2011-12-28 ENCOUNTER — Ambulatory Visit (HOSPITAL_COMMUNITY)
Admission: RE | Admit: 2011-12-28 | Discharge: 2011-12-28 | Disposition: A | Discharge status: Home / Self Care | Payer: BC Managed Care – PPO | Source: Ambulatory Visit | Attending: Oncology | Admitting: Oncology

## 2011-12-28 ENCOUNTER — Other Ambulatory Visit (HOSPITAL_BASED_OUTPATIENT_CLINIC_OR_DEPARTMENT_OTHER): Payer: BC Managed Care – PPO | Admitting: Lab

## 2011-12-28 DIAGNOSIS — C189 Malignant neoplasm of colon, unspecified: Secondary | ICD-10-CM

## 2011-12-28 DIAGNOSIS — R911 Solitary pulmonary nodule: Secondary | ICD-10-CM | POA: Insufficient documentation

## 2011-12-28 DIAGNOSIS — K8020 Calculus of gallbladder without cholecystitis without obstruction: Secondary | ICD-10-CM | POA: Insufficient documentation

## 2011-12-28 DIAGNOSIS — E079 Disorder of thyroid, unspecified: Secondary | ICD-10-CM | POA: Insufficient documentation

## 2011-12-28 DIAGNOSIS — M47814 Spondylosis without myelopathy or radiculopathy, thoracic region: Secondary | ICD-10-CM | POA: Insufficient documentation

## 2011-12-28 LAB — COMPREHENSIVE METABOLIC PANEL
ALT: 10 U/L (ref 0–35)
Alkaline Phosphatase: 68 U/L (ref 39–117)
CO2: 30 mEq/L (ref 19–32)
Creatinine, Ser: 0.68 mg/dL (ref 0.50–1.10)
Glucose, Bld: 80 mg/dL (ref 70–99)
Total Bilirubin: 0.4 mg/dL (ref 0.3–1.2)

## 2011-12-28 LAB — CEA: CEA: 0.7 ng/mL (ref 0.0–5.0)

## 2012-01-07 ENCOUNTER — Telehealth: Payer: Self-pay | Admitting: Oncology

## 2012-01-07 ENCOUNTER — Ambulatory Visit (HOSPITAL_BASED_OUTPATIENT_CLINIC_OR_DEPARTMENT_OTHER): Payer: BC Managed Care – PPO | Admitting: Oncology

## 2012-01-07 VITALS — BP 140/81 | HR 83 | Temp 97.1°F | Ht 63.0 in | Wt 206.0 lb

## 2012-01-07 DIAGNOSIS — C189 Malignant neoplasm of colon, unspecified: Secondary | ICD-10-CM

## 2012-01-07 DIAGNOSIS — Z85038 Personal history of other malignant neoplasm of large intestine: Secondary | ICD-10-CM

## 2012-01-07 NOTE — Telephone Encounter (Signed)
appt made and printed for 07/15/12    aom

## 2012-01-07 NOTE — Progress Notes (Signed)
OFFICE PROGRESS NOTE   INTERVAL HISTORY:   She returns as scheduled. She reports mild numbness in the toes. This does not interfere with walking. She has "stiffness "in the back when she first stands up. She denies difficulty with bowel function.  Objective:  Vital signs in last 24 hours:  Blood pressure 140/81, pulse 83, temperature 97.1 F (36.2 C), temperature source Oral, height 5\' 3"  (1.6 m), weight 206 lb (93.441 kg).    HEENT: Neck without mass Lymphatics: No cervical, supraclavicular, axillary, or inguinal lymph node Resp: Lungs clear bilaterally Cardio: Regular rate and rhythm GI: No hepatosplenomegaly. No mass. Midline scar without evidence of recurrent tumor. Vascular: No leg edema     Lab Results:  Lab Results  Component Value Date   WBC 5.1 11/20/2011   HGB 12.9 11/20/2011   HCT 38.9 11/20/2011   MCV 90.5 11/20/2011   PLT 225 11/20/2011   CEA on 12/28/2011-0.7  X-rays: Restaging CT scan of the chest on 12/28/2011-stable right middle lobe nodule,  Medications: I have reviewed the patient's current medications.  Assessment/Plan: 1. 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  She completed 11 cycles of adjuvant FOLFOX chemotherapy.  Oxaliplatin was deleted with cycle #10.   2. History of multiple colonic polyps. 3. Family history of colon and small bowel cancer. 4. History of microcytic anemia. 5. Diabetes. 6. Hypothyroidism. 7. G3, P3. 8. A 7 mm right upper lung nodule noted on a CT October 02, 2010 (5 mm on December 08, 2009). Stable on a CT of the chest 12/28/2011. 9. Oxaliplatin neuropathy - improved. 10. Status post Port-A-Cath removal.   Disposition:  She remains in remission from colon cancer. She will return for an office visit and CEA in 6 months. We will refer her to Dr. Loreta Ave for a surveillance colonoscopy.   Lucile Shutters, MD  01/07/2012  4:05 PM

## 2012-02-15 ENCOUNTER — Other Ambulatory Visit: Payer: Self-pay | Admitting: *Deleted

## 2012-02-15 DIAGNOSIS — E119 Type 2 diabetes mellitus without complications: Secondary | ICD-10-CM

## 2012-02-15 LAB — HEMOGLOBIN A1C
Hgb A1c MFr Bld: 6.9 % — ABNORMAL HIGH (ref <5.7)
Mean Plasma Glucose: 151 mg/dL — ABNORMAL HIGH (ref <117)

## 2012-02-15 LAB — LIPID PANEL
HDL: 51 mg/dL (ref >39)
LDL Cholesterol: 120 mg/dL — ABNORMAL HIGH (ref 0–99)
Triglycerides: 253 mg/dL — ABNORMAL HIGH (ref <150)

## 2012-02-15 LAB — BASIC METABOLIC PANEL
BUN: 17 mg/dL (ref 6–23)
Calcium: 9.2 mg/dL (ref 8.4–10.5)
Glucose, Bld: 148 mg/dL — ABNORMAL HIGH (ref 70–99)
Potassium: 4.3 mEq/L (ref 3.5–5.3)
Sodium: 141 mEq/L (ref 135–145)

## 2012-02-19 ENCOUNTER — Ambulatory Visit: Payer: BC Managed Care – PPO | Admitting: Internal Medicine

## 2012-02-21 ENCOUNTER — Ambulatory Visit: Payer: BC Managed Care – PPO | Admitting: Internal Medicine

## 2012-02-28 ENCOUNTER — Ambulatory Visit: Payer: BC Managed Care – PPO | Admitting: Internal Medicine

## 2012-03-04 ENCOUNTER — Ambulatory Visit: Payer: BC Managed Care – PPO | Admitting: Internal Medicine

## 2012-05-08 ENCOUNTER — Telehealth: Payer: Self-pay | Admitting: *Deleted

## 2012-05-08 DIAGNOSIS — C189 Malignant neoplasm of colon, unspecified: Secondary | ICD-10-CM

## 2012-05-08 NOTE — Telephone Encounter (Signed)
MD has requested CT abd/pelvis with contrast before her July appointment. Last time abdomen/pelvis were imaged was 09/2010. Will need to move her MD labs to few days prior to visit with labs needed for her CT scan. Left VM for patient to call office to discuss and schedule.

## 2012-05-22 ENCOUNTER — Telehealth: Payer: Self-pay | Admitting: *Deleted

## 2012-05-22 NOTE — Telephone Encounter (Signed)
Per Dr. Truett Perna: Needs CT abd/pelvis prior to 07/15/12 visit. Last abd/pelvis imaging was 09/2010. Will also need to move her labs few days prior to visit. Left VM requesting her call office to discuss.

## 2012-07-09 ENCOUNTER — Telehealth: Payer: Self-pay | Admitting: *Deleted

## 2012-07-09 NOTE — Telephone Encounter (Signed)
Left message on voicemail for pt to call office. Dr. Truett Perna recommends CT abd/ pelvis before office visit on 07/15/12.

## 2012-07-09 NOTE — Telephone Encounter (Signed)
Pt returned call, she agrees to CT abd/ pelvis. Request sent to schedulers for appt.

## 2012-07-10 ENCOUNTER — Other Ambulatory Visit: Payer: Self-pay | Admitting: *Deleted

## 2012-07-10 ENCOUNTER — Telehealth: Payer: Self-pay | Admitting: *Deleted

## 2012-07-10 NOTE — Telephone Encounter (Signed)
Left VM confirming the CT scan on 7/15 at 2pm and MD visit next day.

## 2012-07-10 NOTE — Telephone Encounter (Signed)
Called back and left VM instructing her to come to office before CT scan on 7/15 for labs at 12:15pm instead of am of MD visit. Need to access kidney functions prior to scan.

## 2012-07-10 NOTE — Telephone Encounter (Signed)
Left voice message to inform the patient of the new date and time on 07-14-2012 arrival time 1:45pm

## 2012-07-14 ENCOUNTER — Ambulatory Visit (HOSPITAL_COMMUNITY): Admission: RE | Admit: 2012-07-14 | Payer: BC Managed Care – PPO | Source: Ambulatory Visit

## 2012-07-14 ENCOUNTER — Other Ambulatory Visit (HOSPITAL_BASED_OUTPATIENT_CLINIC_OR_DEPARTMENT_OTHER): Payer: BC Managed Care – PPO

## 2012-07-14 ENCOUNTER — Telehealth: Payer: Self-pay | Admitting: *Deleted

## 2012-07-14 DIAGNOSIS — C180 Malignant neoplasm of cecum: Secondary | ICD-10-CM

## 2012-07-14 DIAGNOSIS — C189 Malignant neoplasm of colon, unspecified: Secondary | ICD-10-CM

## 2012-07-14 LAB — CMP (CANCER CENTER ONLY)
ALT(SGPT): 21 U/L (ref 10–47)
AST: 21 U/L (ref 11–38)
Albumin: 3.6 g/dL (ref 3.3–5.5)
CO2: 31 mEq/L (ref 18–33)
Calcium: 9 mg/dL (ref 8.0–10.3)
Chloride: 94 mEq/L — ABNORMAL LOW (ref 98–108)
Creat: 0.6 mg/dl (ref 0.6–1.2)
Potassium: 4.1 mEq/L (ref 3.3–4.7)

## 2012-07-14 LAB — CEA: CEA: 1.2 ng/mL (ref 0.0–5.0)

## 2012-07-14 NOTE — Telephone Encounter (Signed)
Left VM that it would be best to cancel her 7/16 visit and reschedule for after her CT scan on 07/23/12. Scan was to be done today, but due to not having contrast she was unable to do scan.

## 2012-07-15 ENCOUNTER — Other Ambulatory Visit: Payer: BC Managed Care – PPO

## 2012-07-15 ENCOUNTER — Ambulatory Visit: Payer: BC Managed Care – PPO | Admitting: Nurse Practitioner

## 2012-07-16 ENCOUNTER — Other Ambulatory Visit: Payer: Self-pay | Admitting: *Deleted

## 2012-07-17 ENCOUNTER — Telehealth: Payer: Self-pay | Admitting: Oncology

## 2012-07-17 NOTE — Telephone Encounter (Signed)
lmonvm adviisng the pt of her July appt

## 2012-07-23 ENCOUNTER — Ambulatory Visit (HOSPITAL_COMMUNITY)
Admission: RE | Admit: 2012-07-23 | Discharge: 2012-07-23 | Disposition: A | Discharge status: Home / Self Care | Payer: BC Managed Care – PPO | Source: Ambulatory Visit | Attending: Oncology | Admitting: Oncology

## 2012-07-23 DIAGNOSIS — D259 Leiomyoma of uterus, unspecified: Secondary | ICD-10-CM | POA: Insufficient documentation

## 2012-07-23 DIAGNOSIS — K8020 Calculus of gallbladder without cholecystitis without obstruction: Secondary | ICD-10-CM | POA: Insufficient documentation

## 2012-07-23 DIAGNOSIS — C189 Malignant neoplasm of colon, unspecified: Secondary | ICD-10-CM

## 2012-07-23 MED ORDER — IOHEXOL 300 MG/ML  SOLN
100.0000 mL | Freq: Once | INTRAMUSCULAR | Status: AC | PRN
  Administered 2012-07-23: 100 mL via INTRAVENOUS

## 2012-07-24 ENCOUNTER — Telehealth: Payer: Self-pay | Admitting: Oncology

## 2012-07-24 NOTE — Telephone Encounter (Signed)
Pt called and r/s appt from 07/28/12 to 08/01/12, notified nurse of Dr. Truett Perna

## 2012-07-28 ENCOUNTER — Ambulatory Visit: Payer: BC Managed Care – PPO | Admitting: Nurse Practitioner

## 2012-07-29 ENCOUNTER — Ambulatory Visit (INDEPENDENT_AMBULATORY_CARE_PROVIDER_SITE_OTHER): Payer: BC Managed Care – PPO | Admitting: Family

## 2012-07-29 ENCOUNTER — Encounter: Payer: Self-pay | Admitting: Family

## 2012-07-29 VITALS — BP 138/86 | HR 65 | Temp 97.8°F | Resp 16 | Ht 63.0 in | Wt 212.1 lb

## 2012-07-29 DIAGNOSIS — C189 Malignant neoplasm of colon, unspecified: Secondary | ICD-10-CM

## 2012-07-29 DIAGNOSIS — E119 Type 2 diabetes mellitus without complications: Secondary | ICD-10-CM

## 2012-07-29 DIAGNOSIS — E039 Hypothyroidism, unspecified: Secondary | ICD-10-CM

## 2012-07-29 LAB — HEMOGLOBIN A1C: Hgb A1c MFr Bld: 7.2 % — ABNORMAL HIGH (ref <5.7)

## 2012-07-29 MED ORDER — METFORMIN HCL 500 MG PO TABS: 500.0000 mg | ORAL_TABLET | Freq: Two times a day (BID) | ORAL | Status: DC

## 2012-07-29 MED ORDER — LEVOTHYROXINE SODIUM 88 MCG PO TABS: 88.0000 ug | ORAL_TABLET | Freq: Every day | ORAL | Status: DC

## 2012-07-29 NOTE — Assessment & Plan Note (Signed)
Defer management to oncology

## 2012-07-29 NOTE — Assessment & Plan Note (Signed)
Obtain TSH.  Continue synthroid.  

## 2012-07-29 NOTE — Assessment & Plan Note (Signed)
Not checking sugars.  Recommended that she add baby aspirin once daily. Continue metformin.  Recommended eye exam.  Pt states that she will "work on it." Check A1C, urine microalbumin and bmet today.

## 2012-07-29 NOTE — Progress Notes (Signed)
  Subjective:    Patient ID: Jasmine Morse, female    DOB: 10-24-1951, 61 y.o.   MRN: [id number]  HPI  DM2- Denies polyuria,  Polydipsia.  Taking metformin.  Hypothyroid- continues synthroid, energy is good.  Colon Cancer-She is following with Dr. Truett Perna.  She has follow up with his assistant on Friday.  She is s/p chemo three years ago.  Review of Systems See HPI  Past Medical History  Diagnosis Date  . Thyroid disease     hypothyroidism  . Diabetes mellitus     type II  . Cancer     colon cancer stage III (T3, N2b) s/p right hemicolectomy, FOLFOX-- Dr Myrle Sheng    History   Social History  . Marital Status: Widowed    Spouse Name: N/A    Number of Children: 3  . Years of Education: N/A   Occupational History  . [employer]    Social History Main Topics  . Smoking status: Never Smoker   . Smokeless tobacco: Never Used  . Alcohol Use: No  . Drug Use: No  . Sexually Active: Not on file   Other Topics Concern  . Not on file   Social History Narrative  . No narrative on file    Past Surgical History  Procedure Date  . Cesarean section     x 3  . Tonsillectomy   . Inner ear surgery     due to numerous ear infections    Family History  Problem Relation Age of Onset  . Cancer Father     colon  . Cancer Brother     colon  . Diabetes Other   . Hypertension Other   . Stroke Other     Allergies  Allergen Reactions  . Codeine     REACTION: Nausea \\T \ Diarrhea    Current Outpatient Prescriptions on File Prior to Visit  Medication Sig Dispense Refill  . glucose blood (ACCU-CHEK AVIVA) test strip Use to test blood sugar before morning meal.       . Lancets Misc. (ACCU-CHEK MULTICLIX LANCET DEV) KIT Use as directed       . levothyroxine (SYNTHROID, LEVOTHROID) 88 MCG tablet Take 1 tablet (88 mcg total) by mouth daily.  30 tablet  6  . metFORMIN (GLUCOPHAGE) 500 MG tablet Take 1 tablet (500 mg total) by mouth 2 (two) times daily with a meal.  30 tablet   4    BP 138/86  Pulse 65  Temp 97.8 F (36.6 C) (Oral)  Resp 16  Ht 5\' 3"  (1.6 m)  Wt 212 lb 1.3 oz (96.199 kg)  BMI 37.57 kg/m2  SpO2 97%       Objective:   Physical Exam  Constitutional: She appears well-developed and well-nourished.  HENT:  Head: Normocephalic and atraumatic.  Cardiovascular: Normal rate and regular rhythm.   No murmur heard. Pulmonary/Chest: Effort normal and breath sounds normal. No respiratory distress. She has no wheezes. She has no rales. She exhibits no tenderness.  Musculoskeletal: She exhibits no edema.  Psychiatric: She has a normal mood and affect. Her behavior is normal. Judgment and thought content normal.          Assessment & Plan:

## 2012-07-29 NOTE — Patient Instructions (Addendum)
Please complete your lab work prior to leaving. Follow up in 3 months.   

## 2012-07-30 ENCOUNTER — Telehealth: Payer: Self-pay | Admitting: Family

## 2012-07-30 DIAGNOSIS — E039 Hypothyroidism, unspecified: Secondary | ICD-10-CM

## 2012-07-30 NOTE — Telephone Encounter (Signed)
Pls call pt and let her know that her lab work shows that she needs thyroid medication.  How long has it been since she last took her levothyroxine?  She should resume and plan to repeat TSH in 6 weeks. Is she taking metformin?  If so,  I would like her to increase metformin to 1000mg  bid instead of 500mg  bid as her sugar is above goal.

## 2012-07-31 NOTE — Telephone Encounter (Signed)
Notified pt. She states she had been out of thyroid medication x 2 weeks and will resume previous dose. Pt states that the pharmacy told her they had not received refill from our office. Spoke to [place] at [facility] and verified receipt of rx and it is ready to be picked up. Notified pt. Pt also states she has not been taking metformin regularly. Advised pt to resume 500mg  twice a day on a regular basis. Pt voices understanding.

## 2012-07-31 NOTE — Telephone Encounter (Signed)
Future lab order placed for the week of 09/11/12. Copy given to the lab and mailed to pt as reminder.

## 2012-07-31 NOTE — Telephone Encounter (Signed)
Attempted to reach pt and left message on home # to return my call. 

## 2012-07-31 NOTE — Addendum Note (Signed)
Addended by: Mervin Kung A on: 07/31/2012 04:20 PM   Modules accepted: Orders

## 2012-08-01 ENCOUNTER — Telehealth: Payer: Self-pay | Admitting: Oncology

## 2012-08-01 ENCOUNTER — Ambulatory Visit (HOSPITAL_BASED_OUTPATIENT_CLINIC_OR_DEPARTMENT_OTHER): Payer: BC Managed Care – PPO | Admitting: Nurse Practitioner

## 2012-08-01 VITALS — BP 124/75 | HR 71 | Temp 97.0°F | Resp 20 | Ht 63.0 in | Wt 212.3 lb

## 2012-08-01 DIAGNOSIS — E039 Hypothyroidism, unspecified: Secondary | ICD-10-CM

## 2012-08-01 DIAGNOSIS — C189 Malignant neoplasm of colon, unspecified: Secondary | ICD-10-CM

## 2012-08-01 DIAGNOSIS — E119 Type 2 diabetes mellitus without complications: Secondary | ICD-10-CM

## 2012-08-01 DIAGNOSIS — C180 Malignant neoplasm of cecum: Secondary | ICD-10-CM

## 2012-08-01 NOTE — Telephone Encounter (Signed)
appt made and printed for pt aom °

## 2012-08-01 NOTE — Progress Notes (Signed)
OFFICE PROGRESS NOTE  Interval history:  Ms. Hench returns as scheduled. She feels well. Bowels moving regularly. No hematochezia or melena. No nausea or vomiting. She had a "sharp pain" in the abdomen yesterday which she thinks was gas related. She denies consistent abdominal pain. Toes occasionally feel numb.   Objective: Blood pressure 124/75, pulse 71, temperature 97 F (36.1 C), temperature source Oral, resp. rate 20, height 5\' 3"  (1.6 m), weight 212 lb 4.8 oz (96.299 kg).  Oropharynx is without thrush or ulceration. No palpable cervical, supraclavicular or inguinal lymph nodes. Small bilateral axillary lymph nodes. Lungs clear. Regular cardiac rhythm. Abdomen soft and nontender. No organomegaly. Extremities without edema.  Lab Results: Lab Results  Component Value Date   WBC 5.1 11/20/2011   HGB 12.9 11/20/2011   HCT 38.9 11/20/2011   MCV 90.5 11/20/2011   PLT 225 11/20/2011    Chemistry:    Chemistry      Component Value Date/Time   NA 134 07/14/2012 1430   NA 141 02/15/2012 1554   K 4.1 07/14/2012 1430   K 4.3 02/15/2012 1554   CL 94* 07/14/2012 1430   CL 103 02/15/2012 1554   CO2 31 07/14/2012 1430   CO2 29 02/15/2012 1554   BUN 13 07/14/2012 1430   BUN 17 02/15/2012 1554   CREATININE 0.6 07/14/2012 1430   CREATININE 0.68 12/28/2011 1205      Component Value Date/Time   CALCIUM 9.0 07/14/2012 1430   CALCIUM 9.2 02/15/2012 1554   ALKPHOS 77 07/14/2012 1430   ALKPHOS 68 12/28/2011 1205   AST 21 07/14/2012 1430   AST 16 12/28/2011 1205   ALT 10 12/28/2011 1205   BILITOT 0.80 07/14/2012 1430   BILITOT 0.4 12/28/2011 1205       Studies/Results: Ct Abdomen Pelvis W Contrast  07/23/2012  *RADIOLOGY REPORT*  Clinical Data: Colon cancer diagnosed in October 2010 status post chemotherapy and surgical resection.  CT ABDOMEN AND PELVIS WITH CONTRAST  Technique:  Multidetector CT imaging of the abdomen and pelvis was performed following the standard protocol during bolus  administration of intravenous contrast.  Contrast: OMNIPAQUE IOHEXOL 300 MG/ML  SOLN  Comparison: CT of chest abdomen pelvis 10/02/2010.  Findings:  Lung Bases: Unremarkable.  Abdomen/Pelvis:  Numerous large partially calcified and centrally cavitary gallstones fill the lumen of the gallbladder.  The gallbladder is contracted, and there is no gallbladder wall thickening, pericholecystic fluid or stranding to suggest acute cholecystitis at this time.  There is diffusely decreased attenuation throughout the hepatic parenchyma, suggestive of a background of hepatic steatosis (this is difficult to judge on a contrast enhanced study).  No focal cystic or solid hepatic lesions and no intrahepatic biliary ductal dilatation.  The enhanced appearance of the pancreas, spleen, and bilateral adrenal glands and bilateral kidneys is unremarkable.  Postoperative changes of right hemicolectomy.  No focal soft tissue mass identified within the mesentery.  No ascites or pneumoperitoneum and no pathologic distension of bowel.  No definite pathologic lymphadenopathy identified within the abdomen or pelvis.  A 9 mm fatty attenuation lesion in the posterolateral aspect of the body of the uterus on the right is most compatible with a degenerating fibroid (unchanged.).  A 2.0 cm heterogeneously enhancing lesion in the anterior aspect of the body of the uterus is compatible with an additional fibroid.  Ovaries are unremarkable in appearance.  Urinary bladder is normal in appearance.  Musculoskeletal: There are no aggressive appearing lytic or blastic lesions noted in the visualized  portions of the skeleton.  IMPRESSION: 1.  Status post right hemicolectomy, without evidence of local recurrence or new metastatic disease in the abdomen or pelvis. 2.  Cholelithiasis without evidence to suggest acute cholecystitis at this time. 3.  Uterine fibroids, as above. 4. Probable hepatic steatosis.  Original Report Authenticated By: Florencia Reasons, M.D.    Medications: I have reviewed the patient's current medications.  Assessment/Plan:  1. 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 She completed 11 cycles of adjuvant FOLFOX chemotherapy. Oxaliplatin was deleted with cycle #10. CEA normal at 1.2 on 07/14/2012. CT scans abdomen/pelvis 07/23/2012 with no evidence of local recurrence or new metastatic disease in the abdomen or pelvis. 2. History of multiple colonic polyps. 3. Family history of colon and small bowel cancer. 4. History of microcytic anemia. 5. Diabetes. 6. Hypothyroidism. 7. G3, P3. 8. A 7 mm right upper lung nodule noted on a CT October 02, 2010 (5 mm on December 08, 2009). Stable on a CT of the chest 12/28/2011. 9. Oxaliplatin neuropathy. Improved. 10. Status post Port-A-Cath removal. 11. Colonoscopy 04/02/2012 with 2 small internal hemorrhoids, 2 left colon polyps removed by cold snare, healthy anastomosis at 140 cm, and normal terminal ileum. Pathology on the polyps showed hyperplastic polyps.  Disposition-Ms. Ponds appears stable. She remains in remission from colon cancer. She will return for a followup visit and CEA in 6 months. She will contact the office in the interim with any problems.  Plan reviewed with Dr. Truett Perna.  Lonna Cobb ANP/GNP-BC

## 2012-08-01 NOTE — Telephone Encounter (Signed)
appts made and printed for pt aom °

## 2012-12-10 ENCOUNTER — Telehealth: Payer: Self-pay | Admitting: Internal Medicine

## 2012-12-10 MED ORDER — LEVOTHYROXINE SODIUM 88 MCG PO TABS: 88.0000 ug | ORAL_TABLET | Freq: Every day | ORAL | Status: DC

## 2012-12-10 NOTE — Telephone Encounter (Signed)
Patient scheduled an appointment for med refill for 12/30/12. She would like to know if Dr. Rodena Medin would refill synthroid to last her until her appointment. Please send refill to [facility].

## 2012-12-10 NOTE — Telephone Encounter (Signed)
Rx sent, left Pt detail message.  

## 2012-12-30 ENCOUNTER — Ambulatory Visit: Payer: BC Managed Care – PPO | Admitting: Internal Medicine

## 2013-01-13 ENCOUNTER — Encounter: Payer: Self-pay | Admitting: Internal Medicine

## 2013-01-13 ENCOUNTER — Ambulatory Visit (INDEPENDENT_AMBULATORY_CARE_PROVIDER_SITE_OTHER): Payer: BC Managed Care – PPO | Admitting: Internal Medicine

## 2013-01-13 VITALS — BP 120/80 | HR 74 | Temp 98.3°F | Resp 16 | Wt 219.2 lb

## 2013-01-13 DIAGNOSIS — Z23 Encounter for immunization: Secondary | ICD-10-CM

## 2013-01-13 DIAGNOSIS — E119 Type 2 diabetes mellitus without complications: Secondary | ICD-10-CM

## 2013-01-13 DIAGNOSIS — E039 Hypothyroidism, unspecified: Secondary | ICD-10-CM

## 2013-01-13 MED ORDER — METFORMIN HCL 500 MG PO TABS: 500.0000 mg | ORAL_TABLET | Freq: Two times a day (BID) | ORAL | Status: DC

## 2013-01-13 MED ORDER — LEVOTHYROXINE SODIUM 88 MCG PO TABS: 88.0000 ug | ORAL_TABLET | Freq: Every day | ORAL | Status: DC

## 2013-01-13 NOTE — Patient Instructions (Signed)
Please return for fasting labs on Monday Cbc, chem7, a1c-250.00, lipid.lft-272.4 and tsh,free t4-hypothyroidism

## 2013-01-19 ENCOUNTER — Telehealth: Payer: Self-pay | Admitting: *Deleted

## 2013-01-19 DIAGNOSIS — E039 Hypothyroidism, unspecified: Secondary | ICD-10-CM

## 2013-01-19 DIAGNOSIS — E119 Type 2 diabetes mellitus without complications: Secondary | ICD-10-CM

## 2013-01-19 DIAGNOSIS — E785 Hyperlipidemia, unspecified: Secondary | ICD-10-CM

## 2013-01-19 LAB — TSH: TSH: 2.199 u[IU]/mL (ref 0.350–4.500)

## 2013-01-19 LAB — CBC WITH DIFFERENTIAL/PLATELET
Eosinophils Absolute: 0.1 10*3/uL (ref 0.0–0.7)
Eosinophils Relative: 2 % (ref 0–5)
HCT: 38.9 % (ref 36.0–46.0)
Hemoglobin: 13.2 g/dL (ref 12.0–15.0)
Lymphocytes Relative: 38 % (ref 12–46)
Lymphs Abs: 1.6 10*3/uL (ref 0.7–4.0)
MCH: 29.5 pg (ref 26.0–34.0)
MCV: 87 fL (ref 78.0–100.0)
Monocytes Absolute: 0.5 10*3/uL (ref 0.1–1.0)
Monocytes Relative: 11 % (ref 3–12)
RBC: 4.47 MIL/uL (ref 3.87–5.11)
WBC: 4.2 10*3/uL (ref 4.0–10.5)

## 2013-01-19 LAB — T4, FREE: Free T4: 1.12 ng/dL (ref 0.80–1.80)

## 2013-01-19 LAB — BASIC METABOLIC PANEL
BUN: 11 mg/dL (ref 6–23)
CO2: 25 mEq/L (ref 19–32)
Chloride: 100 mEq/L (ref 96–112)
Creat: 0.76 mg/dL (ref 0.50–1.10)
Glucose, Bld: 148 mg/dL — ABNORMAL HIGH (ref 70–99)

## 2013-01-19 LAB — HEPATIC FUNCTION PANEL
ALT: 30 U/L (ref 0–35)
AST: 35 U/L (ref 0–37)
Albumin: 4.3 g/dL (ref 3.5–5.2)
Alkaline Phosphatase: 67 U/L (ref 39–117)
Total Bilirubin: 0.7 mg/dL (ref 0.3–1.2)

## 2013-01-19 LAB — LIPID PANEL
Total CHOL/HDL Ratio: 4.7 Ratio
VLDL: 52 mg/dL — ABNORMAL HIGH (ref 0–40)

## 2013-01-19 NOTE — Telephone Encounter (Signed)
Pt presented to the lab. Orders entered per 01/13/13 office note as below:  Please return for fasting labs on Monday  Cbc, chem7, a1c-250.00, lipid.lft-272.4 and tsh,free t4-hypothyroidism

## 2013-01-23 NOTE — Progress Notes (Signed)
  Subjective:    Patient ID: Jasmine Morse, female    DOB: June 12, 1951, 62 y.o.   MRN: [id number]  HPI Pt presents to clinic for followup of multiple medical problems. Last followed up in clinic 11/12. No complaints.  Past Medical History  Diagnosis Date  . Thyroid disease     hypothyroidism  . Diabetes mellitus     type II  . Cancer     colon cancer stage III (T3, N2b) s/p right hemicolectomy, FOLFOX-- Dr Myrle Sheng   Past Surgical History  Procedure Date  . Cesarean section     x 3  . Tonsillectomy   . Inner ear surgery     due to numerous ear infections    reports that she has never smoked. She has never used smokeless tobacco. She reports that she does not drink alcohol or use illicit drugs. family history includes Cancer in her brother and father; Diabetes in her other; Hypertension in her other; and Stroke in her other. Allergies  Allergen Reactions  . Codeine     REACTION: Nausea \\T \ Diarrhea      Review of Systems see hpi     Objective:   Physical Exam  Physical Exam  Nursing note and vitals reviewed. Constitutional: Appears well-developed and well-nourished. No distress.  HENT:  Head: Normocephalic and atraumatic.  Right Ear: External ear normal.  Left Ear: External ear normal.  Eyes: Conjunctivae are normal. No scleral icterus.  Neck: Neck supple. Carotid bruit is not present.  Cardiovascular: Normal rate, regular rhythm and normal heart sounds.  Exam reveals no gallop and no friction rub.   No murmur heard. Pulmonary/Chest: Effort normal and breath sounds normal. No respiratory distress. He has no wheezes. no rales.  Lymphadenopathy:    He has no cervical adenopathy.  Neurological:Alert.  Skin: Skin is warm and dry. Not diaphoretic.  Psychiatric: Has a normal mood and affect.  Diabetic foot exam: +2 DP pulses, no diabetic wounds, ulcerations or significant callousing. Monofilament exam nl.       Assessment & Plan:

## 2013-01-23 NOTE — Assessment & Plan Note (Signed)
Obtain tsh and free t4 

## 2013-01-23 NOTE — Assessment & Plan Note (Signed)
Obtain cbc, chem7, a1c. Recommend diabetic eye exam referral and pt states will consider. Recommend regular exercise and wt loss.

## 2013-02-03 ENCOUNTER — Other Ambulatory Visit: Payer: BC Managed Care – PPO | Admitting: Lab

## 2013-02-03 DIAGNOSIS — C189 Malignant neoplasm of colon, unspecified: Secondary | ICD-10-CM

## 2013-02-03 LAB — CEA: CEA: 1.1 ng/mL (ref 0.0–5.0)

## 2013-02-04 MED ORDER — METFORMIN HCL 1000 MG PO TABS: 1000.0000 mg | ORAL_TABLET | Freq: Two times a day (BID) | ORAL | Status: DC

## 2013-02-04 MED ORDER — ATORVASTATIN CALCIUM 20 MG PO TABS: 20.0000 mg | ORAL_TABLET | Freq: Every day | ORAL | Status: DC

## 2013-02-04 NOTE — Addendum Note (Signed)
Addended by: Court Joy on: 02/04/2013 10:55 AM   Modules accepted: Orders

## 2013-02-05 ENCOUNTER — Ambulatory Visit (HOSPITAL_BASED_OUTPATIENT_CLINIC_OR_DEPARTMENT_OTHER): Payer: BC Managed Care – PPO | Admitting: Oncology

## 2013-02-05 ENCOUNTER — Telehealth: Payer: Self-pay | Admitting: Oncology

## 2013-02-05 VITALS — BP 146/76 | HR 83 | Temp 97.5°F | Resp 18 | Ht 63.0 in | Wt 216.8 lb

## 2013-02-05 DIAGNOSIS — R911 Solitary pulmonary nodule: Secondary | ICD-10-CM

## 2013-02-05 DIAGNOSIS — Z800 Family history of malignant neoplasm of digestive organs: Secondary | ICD-10-CM

## 2013-02-05 DIAGNOSIS — C189 Malignant neoplasm of colon, unspecified: Secondary | ICD-10-CM

## 2013-02-05 DIAGNOSIS — C180 Malignant neoplasm of cecum: Secondary | ICD-10-CM

## 2013-02-05 DIAGNOSIS — D509 Iron deficiency anemia, unspecified: Secondary | ICD-10-CM

## 2013-02-05 NOTE — Progress Notes (Signed)
   [facility]    OFFICE PROGRESS NOTE   INTERVAL HISTORY:   She returns as scheduled. She feels well. She complains of pain at the left heel for the past several weeks. No leg swelling. The neuropathy symptoms have resolved.  Objective:  Vital signs in last 24 hours:  Blood pressure 146/76, pulse 83, temperature 97.5 F (36.4 C), temperature source Oral, resp. rate 18, height 5\' 3"  (1.6 m), weight 216 lb 12.8 oz (98.34 kg).    HEENT: Neck without mass Lymphatics: No cervical, supraclavicular, axillary, or inguinal nodes Resp: Lungs clear bilaterally Cardio: Regular rate and rhythm GI: No hepatomegaly, nontender, no mass Vascular: No leg edema  Musculoskeletal: Examination of the left foot and heel is unremarkable     Lab Results:  Lab Results  Component Value Date   WBC 4.2 01/19/2013   HGB 13.2 01/19/2013   HCT 38.9 01/19/2013   MCV 87.0 01/19/2013   PLT 217 01/19/2013   ANC 2.0  CEA on 02/03/2013-1.1    Medications: I have reviewed the patient's current medications.  Assessment/Plan: 1. 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 She completed 11 cycles of adjuvant FOLFOX chemotherapy. Oxaliplatin was deleted with cycle #10. CEA normal at 1.2 on 07/14/2012. CT scans abdomen/pelvis 07/23/2012 with no evidence of local recurrence or new metastatic disease in the abdomen or pelvis. 2. History of multiple colonic polyps. 3. Family history of colon and small bowel cancer. 4. History of microcytic anemia. 5. Diabetes. 6. Hypothyroidism. 7. G3, P3. 8. A 7 mm right upper lung nodule noted on a CT October 02, 2010 (5 mm on December 08, 2009). Stable on a CT of the chest 12/28/2011. 9. Oxaliplatin neuropathy. Improved. 10. Status post Port-A-Cath removal. 11. Colonoscopy 04/02/2012 with 2 small internal hemorrhoids, 2 left colon polyps removed by cold snare, healthy anastomosis at 140 cm, and normal terminal  ileum. Pathology on the polyps showed hyperplastic polyps.   Disposition:  She remains in clinical remission from colon cancer. Ms. Capano will return for an office visit and CEA in 6 months. She continues colonoscopy followup with Dr. Loreta Ave.   Thornton Papas, MD  02/05/2013  4:28 PM

## 2013-02-05 NOTE — Telephone Encounter (Signed)
Gave pt appt for August 2014 lab and MD

## 2013-05-12 ENCOUNTER — Encounter: Payer: Self-pay | Admitting: Family Medicine

## 2013-05-12 ENCOUNTER — Ambulatory Visit (INDEPENDENT_AMBULATORY_CARE_PROVIDER_SITE_OTHER): Payer: BC Managed Care – PPO | Admitting: Family Medicine

## 2013-05-12 VITALS — BP 110/72 | HR 78 | Temp 98.3°F | Ht 63.0 in | Wt 213.1 lb

## 2013-05-12 DIAGNOSIS — E119 Type 2 diabetes mellitus without complications: Secondary | ICD-10-CM

## 2013-05-12 DIAGNOSIS — E785 Hyperlipidemia, unspecified: Secondary | ICD-10-CM

## 2013-05-12 DIAGNOSIS — C189 Malignant neoplasm of colon, unspecified: Secondary | ICD-10-CM

## 2013-05-12 DIAGNOSIS — E039 Hypothyroidism, unspecified: Secondary | ICD-10-CM

## 2013-05-12 LAB — CBC
HCT: 38 % (ref 36.0–46.0)
Hemoglobin: 12.9 g/dL (ref 12.0–15.0)
MCH: 29.7 pg (ref 26.0–34.0)
MCV: 87.6 fL (ref 78.0–100.0)
RBC: 4.34 MIL/uL (ref 3.87–5.11)

## 2013-05-12 LAB — HEPATIC FUNCTION PANEL
Bilirubin, Direct: 0.1 mg/dL (ref 0.0–0.3)
Total Bilirubin: 0.5 mg/dL (ref 0.3–1.2)

## 2013-05-12 LAB — RENAL FUNCTION PANEL
CO2: 29 mEq/L (ref 19–32)
Glucose, Bld: 186 mg/dL — ABNORMAL HIGH (ref 70–99)
Potassium: 4.3 mEq/L (ref 3.5–5.3)
Sodium: 141 mEq/L (ref 135–145)

## 2013-05-12 LAB — LIPID PANEL: Triglycerides: 466 mg/dL — ABNORMAL HIGH (ref <150)

## 2013-05-12 LAB — HEMOGLOBIN A1C: Hgb A1c MFr Bld: 7.3 % — ABNORMAL HIGH (ref <5.7)

## 2013-05-12 NOTE — Progress Notes (Signed)
Patient ID: Jasmine Morse, female   DOB: [birth year], 62 y.o.   MRN: [id number] Ki[facility] [place] [facility] [id number] 23-Jun-1951 05/12/2013      Progress Note-Follow Up  Subjective  Chief Complaint  Chief Complaint  Patient presents with  . Follow-up    4 month    HPI  Patient is a 62 year old female who is in today for followup. Overall she's doing well. She notes her blood sugars are improving. She denies polyuria or polydipsia. No recent illness. No headaches, chest pain, palpitations, shortness of breath, GI complaints. Is now presently taking her medications as prescribed.  Past Medical History  Diagnosis Date  . Thyroid disease     hypothyroidism  . Diabetes mellitus     type II  . Cancer     colon cancer stage III (T3, N2b) s/p right hemicolectomy, FOLFOX-- Dr Myrle Sheng    Past Surgical History  Procedure Laterality Date  . Cesarean section      x 3  . Tonsillectomy    . Inner ear surgery      due to numerous ear infections    Family History  Problem Relation Age of Onset  . Cancer Father     colon  . Cancer Brother     colon  . Diabetes Other   . Hypertension Other   . Stroke Other     History   Social History  . Marital Status: Widowed    Spouse Name: N/A    Number of Children: 3  . Years of Education: N/A   Occupational History  . [employer]    Social History Main Topics  . Smoking status: Never Smoker   . Smokeless tobacco: Never Used  . Alcohol Use: No  . Drug Use: No  . Sexually Active: Not on file   Other Topics Concern  . Not on file   Social History Narrative  . No narrative on file    Current Outpatient Prescriptions on File Prior to Visit  Medication Sig Dispense Refill  . atorvastatin (LIPITOR) 20 MG tablet Take 1 tablet (20 mg total) by mouth daily.  30 tablet  1  . glucose blood (ACCU-CHEK AVIVA) test strip Use to test blood sugar before morning meal.       . Lancets Misc. (ACCU-CHEK MULTICLIX LANCET DEV) KIT Use as directed       .  levothyroxine (SYNTHROID, LEVOTHROID) 88 MCG tablet Take 1 tablet (88 mcg total) by mouth daily.  30 tablet  5  . metFORMIN (GLUCOPHAGE) 1000 MG tablet Take 1 tablet (1,000 mg total) by mouth 2 (two) times daily with a meal.  60 tablet  1   No current facility-administered medications on file prior to visit.    Allergies  Allergen Reactions  . Codeine     REACTION: Nausea \\T \ Diarrhea    Review of Systems  Review of Systems  Constitutional: Negative for fever and malaise/fatigue.  HENT: Negative for congestion.   Eyes: Negative for discharge.  Respiratory: Negative for shortness of breath.   Cardiovascular: Negative for chest pain, palpitations and leg swelling.  Gastrointestinal: Negative for nausea, abdominal pain and diarrhea.  Genitourinary: Negative for dysuria.  Musculoskeletal: Negative for falls.  Skin: Negative for rash.  Neurological: Negative for loss of consciousness and headaches.  Endo/Heme/Allergies: Negative for polydipsia.  Psychiatric/Behavioral: Negative for depression and suicidal ideas. The patient is not nervous/anxious and does not have insomnia.     Objective  BP 110/72  Pulse 78  Temp(Src)  98.3 F (36.8 C) (Oral)  Ht 5\' 3"  (1.6 m)  Wt 213 lb 1.9 oz (96.671 kg)  BMI 37.76 kg/m2  SpO2 93%  Physical Exam  Physical Exam  Constitutional: She is oriented to person, place, and time and well-developed, well-nourished, and in no distress. No distress.  HENT:  Head: Normocephalic and atraumatic.  Eyes: Conjunctivae are normal.  Neck: Neck supple. No thyromegaly present.  Cardiovascular: Normal rate, regular rhythm and normal heart sounds.   No murmur heard. Pulmonary/Chest: Effort normal and breath sounds normal. She has no wheezes.  Abdominal: She exhibits no distension and no mass.  Musculoskeletal: She exhibits no edema.  Lymphadenopathy:    She has no cervical adenopathy.  Neurological: She is alert and oriented to person, place, and time.   Skin: Skin is warm and dry. No rash noted. She is not diaphoretic.  Psychiatric: Memory, affect and judgment normal.    Lab Results  Component Value Date   TSH 2.199 01/19/2013   Lab Results  Component Value Date   WBC 4.2 01/19/2013   HGB 13.2 01/19/2013   HCT 38.9 01/19/2013   MCV 87.0 01/19/2013   PLT 217 01/19/2013   Lab Results  Component Value Date   CREATININE 0.76 01/19/2013   BUN 11 01/19/2013   NA 140 01/19/2013   K 4.6 01/19/2013   CL 100 01/19/2013   CO2 25 01/19/2013   Lab Results  Component Value Date   ALT 30 01/19/2013   AST 35 01/19/2013   ALKPHOS 67 01/19/2013   BILITOT 0.7 01/19/2013   Lab Results  Component Value Date   CHOL 269* 01/19/2013   Lab Results  Component Value Date   HDL 57 01/19/2013   Lab Results  Component Value Date   LDLCALC 160* 01/19/2013   Lab Results  Component Value Date   TRIG 261* 01/19/2013   Lab Results  Component Value Date   CHOLHDL 4.7 01/19/2013     Assessment & Plan  COLON CANCER Follows with oncology, no concerning symptoms  HYPOTHYROIDISM Well treated on current dose of Levothyroxine. No changes  DIABETES-TYPE 2 hgba1c improving and she agrees to continue with dietary and lifestyle changes. No further changes today, minimize simple carbs.

## 2013-05-12 NOTE — Patient Instructions (Addendum)
Next visit annual  Ice, aspercreme, stretch, dr Margart Sickles and compression hose (Jobst stockings) on in am, consider Aleve 220 mg dialy for pain with food Plantar Fasciitis Plantar fasciitis is a common condition that causes foot pain. It is soreness (inflammation) of the band of tough fibrous tissue on the bottom of the foot that runs from the heel bone (calcaneus) to the ball of the foot. The cause of this soreness may be from excessive standing, poor fitting shoes, running on hard surfaces, being overweight, having an abnormal walk, or overuse (this is common in runners) of the painful foot or feet. It is also common in aerobic exercise dancers and ballet dancers. SYMPTOMS  Most people with plantar fasciitis complain of:  Severe pain in the morning on the bottom of their foot especially when taking the first steps out of bed. This pain recedes after a few minutes of walking.  Severe pain is experienced also during walking following a long period of inactivity.  Pain is worse when walking barefoot or up stairs DIAGNOSIS   Your caregiver will diagnose this condition by examining and feeling your foot.  Special tests such as X-rays of your foot, are usually not needed. PREVENTION   Consult a sports medicine professional before beginning a new exercise program.  Walking programs offer a good workout. With walking there is a lower chance of overuse injuries common to runners. There is less impact and less jarring of the joints.  Begin all new exercise programs slowly. If problems or pain develop, decrease the amount of time or distance until you are at a comfortable level.  Wear good shoes and replace them regularly.  Stretch your foot and the heel cords at the back of the ankle (Achilles tendon) both before and after exercise.  Run or exercise on even surfaces that are not hard. For example, asphalt is better than pavement.  Do not run barefoot on hard surfaces.  If using a treadmill,  vary the incline.  Do not continue to workout if you have foot or joint problems. Seek professional help if they do not improve. HOME CARE INSTRUCTIONS   Avoid activities that cause you pain until you recover.  Use ice or cold packs on the problem or painful areas after working out.  Only take over-the-counter or prescription medicines for pain, discomfort, or fever as directed by your caregiver.  Soft shoe inserts or athletic shoes with air or gel sole cushions may be helpful.  If problems continue or become more severe, consult a sports medicine caregiver or your own health care provider. Cortisone is a potent anti-inflammatory medication that may be injected into the painful area. You can discuss this treatment with your caregiver. MAKE SURE YOU:   Understand these instructions.  Will watch your condition.  Will get help right away if you are not doing well or get worse. Document Released: 09/11/2001 Document Revised: 03/10/2012 Document Reviewed: 11/10/2008 [facility] Patient Information 2013 [place], [place].

## 2013-05-15 NOTE — Assessment & Plan Note (Signed)
Well treated on current dose of Levothyroxine. No changes 

## 2013-05-15 NOTE — Assessment & Plan Note (Signed)
hgba1c improving and she agrees to continue with dietary and lifestyle changes. No further changes today, minimize simple carbs.

## 2013-05-15 NOTE — Assessment & Plan Note (Signed)
Follows with oncology, no concerning symptoms

## 2013-08-04 ENCOUNTER — Other Ambulatory Visit: Payer: BC Managed Care – PPO | Admitting: Lab

## 2013-08-04 DIAGNOSIS — C189 Malignant neoplasm of colon, unspecified: Secondary | ICD-10-CM

## 2013-08-06 ENCOUNTER — Telehealth: Payer: Self-pay | Admitting: *Deleted

## 2013-08-06 ENCOUNTER — Ambulatory Visit (HOSPITAL_BASED_OUTPATIENT_CLINIC_OR_DEPARTMENT_OTHER): Payer: BC Managed Care – PPO | Admitting: Nurse Practitioner

## 2013-08-06 VITALS — BP 128/67 | HR 62 | Temp 98.0°F | Resp 18 | Ht 63.0 in | Wt 211.3 lb

## 2013-08-06 DIAGNOSIS — C189 Malignant neoplasm of colon, unspecified: Secondary | ICD-10-CM

## 2013-08-06 NOTE — Progress Notes (Signed)
OFFICE PROGRESS NOTE  Interval history:  Jasmine Morse returns as scheduled. She feels well. No change in bowel habits. No abdominal pain. No bloody or Mayor stools. She has a good appetite. No nausea vomiting. She reports undergoing a colonoscopy in April or May of this year (?) with 2 small polyps removed. The next colonoscopy will be at a 3 year interval.   Objective: Blood pressure 128/67, pulse 62, temperature 98 F (36.7 C), temperature source Oral, resp. rate 18, height 5\' 3"  (1.6 m), weight 211 lb 4.8 oz (95.845 kg), SpO2 99.00%.  No thrush or ulcerations. No palpable cervical, supraclavicular, axillary or inguinal lymph nodes. Lungs are clear. Regular cardiac rhythm. Abdomen soft and nontender. No organomegaly. No mass. Extremities without edema.  Lab Results: Lab Results  Component Value Date   WBC 4.9 05/12/2013   HGB 12.9 05/12/2013   HCT 38.0 05/12/2013   MCV 87.6 05/12/2013   PLT 233 05/12/2013    Chemistry:    Chemistry      Component Value Date/Time   NA 141 05/12/2013 1705   NA 134 07/14/2012 1430   K 4.3 05/12/2013 1705   K 4.1 07/14/2012 1430   CL 101 05/12/2013 1705   CL 94* 07/14/2012 1430   CO2 29 05/12/2013 1705   CO2 31 07/14/2012 1430   BUN 17 05/12/2013 1705   BUN 13 07/14/2012 1430   CREATININE 0.81 05/12/2013 1705   CREATININE 0.68 12/28/2011 1205      Component Value Date/Time   CALCIUM 9.9 05/12/2013 1705   CALCIUM 9.0 07/14/2012 1430   ALKPHOS 62 05/12/2013 1705   ALKPHOS 77 07/14/2012 1430   AST 36 05/12/2013 1705   AST 21 07/14/2012 1430   ALT 36* 05/12/2013 1705   ALT 21 07/14/2012 1430   BILITOT 0.5 05/12/2013 1705   BILITOT 0.80 07/14/2012 1430     08/04/2013 CEA 0.8.  Studies/Results: No results found.  Medications: I have reviewed the patient's current medications.  Assessment/Plan:  1. 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 She completed 11 cycles of adjuvant FOLFOX chemotherapy. Oxaliplatin  was deleted with cycle #10. CEA normal at 1.2 on 07/14/2012. CT scans abdomen/pelvis 07/23/2012 with no evidence of local recurrence or new metastatic disease in the abdomen or pelvis. 2. History of multiple colonic polyps. 3. Family history of colon and small bowel cancer. 4. History of microcytic anemia. 5. Diabetes. 6. Hypothyroidism. 7. G3, P3. 8. A 7 mm right upper lung nodule noted on a CT October 02, 2010 (5 mm on December 08, 2009). Stable on a CT of the chest 12/28/2011. 9. Oxaliplatin neuropathy. Improved. 10. Status post Port-A-Cath removal. 11. Colonoscopy 04/02/2012 with 2 small internal hemorrhoids, 2 left colon polyps removed by cold snare, healthy anastomosis at 140 cm, and normal terminal ileum. Pathology on the polyps showed hyperplastic polyps.  Disposition-Ms. Jasmine Morse appears stable. She remains in clinical remission from colon cancer. She will return for a followup visit and CEA in 6 months. She will contact the office in the interim with any problems.  Plan reviewed with Dr. Truett Morse.   Jasmine Morse ANP/GNP-BC

## 2013-08-06 NOTE — Telephone Encounter (Signed)
appts made and printed...td 

## 2013-08-07 ENCOUNTER — Other Ambulatory Visit: Payer: Self-pay | Admitting: Internal Medicine

## 2013-08-07 NOTE — Telephone Encounter (Signed)
Rx request to pharmacy/SLS  

## 2013-09-18 ENCOUNTER — Ambulatory Visit: Payer: BC Managed Care – PPO | Admitting: Family

## 2013-09-18 DIAGNOSIS — Z0289 Encounter for other administrative examinations: Secondary | ICD-10-CM

## 2013-11-24 ENCOUNTER — Telehealth: Payer: Self-pay | Admitting: Family Medicine

## 2013-11-24 MED ORDER — LEVOTHYROXINE SODIUM 88 MCG PO TABS: 88.0000 ug | ORAL_TABLET | Freq: Every day | ORAL | Status: DC

## 2013-11-24 NOTE — Telephone Encounter (Signed)
Refill levothyroxine sodium 8.45mcg tab #30 take 1 tablet by mouth once daily last fill 10-20-2013

## 2013-12-22 ENCOUNTER — Ambulatory Visit: Payer: BC Managed Care – PPO | Admitting: Family Medicine

## 2013-12-22 DIAGNOSIS — Z0289 Encounter for other administrative examinations: Secondary | ICD-10-CM

## 2014-02-09 ENCOUNTER — Other Ambulatory Visit: Payer: BC Managed Care – PPO

## 2014-02-11 ENCOUNTER — Ambulatory Visit: Payer: BC Managed Care – PPO | Admitting: Nurse Practitioner

## 2014-02-12 ENCOUNTER — Ambulatory Visit: Payer: BC Managed Care – PPO | Admitting: Family Medicine

## 2014-03-16 ENCOUNTER — Telehealth: Payer: Self-pay | Admitting: Family Medicine

## 2014-03-16 MED ORDER — LEVOTHYROXINE SODIUM 88 MCG PO TABS: 88.0000 ug | ORAL_TABLET | Freq: Every day | ORAL | Status: DC

## 2014-03-16 NOTE — Telephone Encounter (Signed)
Refill levothyroxine

## 2014-03-16 NOTE — Telephone Encounter (Signed)
Please inform pt that we sent in a 30 day supply but she needs to schedule a follow up for additional refills.  thanks

## 2014-03-17 NOTE — Telephone Encounter (Signed)
Phone keeps ringing "busy signal"

## 2014-03-22 NOTE — Telephone Encounter (Signed)
Phone keeps ringing "busy signal"

## 2014-03-23 NOTE — Telephone Encounter (Signed)
Pt was last seen in 04/2013 and advised to follow up in November. Pt is past due for follow up. Mailed letter.

## 2014-03-23 NOTE — Telephone Encounter (Signed)
Phone keeps ringing "busy signal" °

## 2014-05-03 ENCOUNTER — Telehealth: Payer: Self-pay | Admitting: Family Medicine

## 2014-05-03 MED ORDER — LEVOTHYROXINE SODIUM 88 MCG PO TABS: 88.0000 ug | ORAL_TABLET | Freq: Every day | ORAL | Status: DC

## 2014-05-03 MED ORDER — METFORMIN HCL 1000 MG PO TABS: 1000.0000 mg | ORAL_TABLET | Freq: Two times a day (BID) | ORAL | Status: DC

## 2014-05-03 NOTE — Telephone Encounter (Signed)
Refill-levothyroxine  Refill- metformin

## 2014-06-18 ENCOUNTER — Encounter: Payer: Self-pay | Admitting: Physician Assistant

## 2014-06-18 ENCOUNTER — Ambulatory Visit (INDEPENDENT_AMBULATORY_CARE_PROVIDER_SITE_OTHER): Payer: BC Managed Care – PPO | Admitting: Physician Assistant

## 2014-06-18 VITALS — BP 122/84 | HR 69 | Temp 98.1°F | Resp 16 | Ht 63.0 in | Wt 210.5 lb

## 2014-06-18 DIAGNOSIS — E119 Type 2 diabetes mellitus without complications: Secondary | ICD-10-CM

## 2014-06-18 DIAGNOSIS — E785 Hyperlipidemia, unspecified: Secondary | ICD-10-CM

## 2014-06-18 DIAGNOSIS — M62830 Muscle spasm of back: Secondary | ICD-10-CM

## 2014-06-18 DIAGNOSIS — E038 Other specified hypothyroidism: Secondary | ICD-10-CM

## 2014-06-18 DIAGNOSIS — M5380 Other specified dorsopathies, site unspecified: Secondary | ICD-10-CM

## 2014-06-18 MED ORDER — LEVOTHYROXINE SODIUM 88 MCG PO TABS: 88.0000 ug | ORAL_TABLET | Freq: Every day | ORAL | Status: DC

## 2014-06-18 MED ORDER — METFORMIN HCL 1000 MG PO TABS: 1000.0000 mg | ORAL_TABLET | Freq: Two times a day (BID) | ORAL | Status: DC

## 2014-06-18 MED ORDER — ATORVASTATIN CALCIUM 20 MG PO TABS: 20.0000 mg | ORAL_TABLET | Freq: Every day | ORAL | Status: DC

## 2014-06-18 NOTE — Patient Instructions (Signed)
Your symptoms are related to posture and prolonged standing. You are also having some muscle spasm.  I recommend you get some work shoes with better support.  Apply topical aspercreme to your lower back.  Stretch before periods of heavy work.  Ibuprofen if needed.  If symptoms do not begin to improve, I recommend you let us obtain imaging and set you up with Physical Therapy.

## 2014-06-18 NOTE — Progress Notes (Signed)
Pre visit review using our clinic review tool, if applicable. No additional management support is needed unless otherwise documented below in the visit note/SLS  

## 2014-06-23 DIAGNOSIS — M62830 Muscle spasm of back: Secondary | ICD-10-CM | POA: Insufficient documentation

## 2014-06-23 NOTE — Progress Notes (Signed)
Patient presents to clinic today c/o intermittent muscle tightness or right lower back that has been becoming more frequent over the past 1-2 months.  Patient denies hx of trauma or injury to lower back.  Denies radiation of "pain" elsewhere.  Denies numbness or tingling of lower extremities.  Has not taken anything for tightness, because it only lasts a few minutes.  Patient request medication refills.  Is overdue for follow-up with her PCP.  Past Medical History  Diagnosis Date  . Thyroid disease     hypothyroidism  . Diabetes mellitus     type II  . Cancer     colon cancer stage III (T3, N2b) s/p right hemicolectomy, FOLFOX-- Dr Learta Codding    Current Outpatient Prescriptions on File Prior to Visit  Medication Sig Dispense Refill  . glucose blood (ACCU-CHEK AVIVA) test strip Use to test blood sugar before morning meal.       . Lancets Misc. (ACCU-CHEK MULTICLIX LANCET DEV) KIT Use as directed        No current facility-administered medications on file prior to visit.    Allergies  Allergen Reactions  . Codeine     REACTION: Nausea \T\ Diarrhea    Family History  Problem Relation Age of Onset  . Cancer Father     colon  . Cancer Brother     colon  . Diabetes Other   . Hypertension Other   . Stroke Other     History   Social History  . Marital Status: Widowed    Spouse Name: N/A    Number of Children: 3  . Years of Education: N/A   Occupational History  . [employer]    Social History Main Topics  . Smoking status: Never Smoker   . Smokeless tobacco: Never Used  . Alcohol Use: No  . Drug Use: No  . Sexual Activity: None   Other Topics Concern  . None   Social History Narrative  . None   Review of Systems - See HPI.  All other ROS are negative.  BP 122/84  Pulse 69  Temp(Src) 98.1 F (36.7 C) (Oral)  Resp 16  Ht _0  (1.6 m)  Wt 210 lb 8 oz (95.482 kg)  BMI 37.30 kg/m2  SpO2 98%  Physical Exam  Vitals reviewed. Constitutional: She is  well-developed, well-nourished, and in no distress.  HENT:  Head: Normocephalic and atraumatic.  Eyes: Conjunctivae are normal.  Neck: Neck supple.  Cardiovascular: Normal rate, regular rhythm, normal heart sounds and intact distal pulses.   Pulmonary/Chest: Effort normal and breath sounds normal. No respiratory distress. She has no wheezes. She has no rales. She exhibits no tenderness.  Musculoskeletal:       Back:  Skin: Skin is warm and dry. No rash noted.  Psychiatric: Affect normal.   Assessment/Plan: Muscle spasm of back Exam unremarkable.  Symptoms sound like a muscle spasm.  Discussed muscle relaxants but patient declines.  Recommend stretching exercises.  Topical Aspercreme or Salon Pas.  Avoid heavy lifting or overexertion.    73-monthsupply of diabetes medications, lipitor and levothyroxine given.  Refills will have to come from PCP.

## 2014-06-23 NOTE — Assessment & Plan Note (Signed)
Exam unremarkable.  Symptoms sound like a muscle spasm.  Discussed muscle relaxants but patient declines.  Recommend stretching exercises.  Topical Aspercreme or Salon Pas.  Avoid heavy lifting or overexertion.    73-month supply of diabetes medications, lipitor and levothyroxine given.  Refills will have to come from PCP.

## 2014-08-10 ENCOUNTER — Encounter: Payer: Self-pay | Admitting: Family Medicine

## 2014-08-10 ENCOUNTER — Ambulatory Visit (INDEPENDENT_AMBULATORY_CARE_PROVIDER_SITE_OTHER): Payer: BC Managed Care – PPO | Admitting: Family Medicine

## 2014-08-10 VITALS — BP 130/82 | HR 69 | Temp 98.2°F | Ht 63.0 in | Wt 208.1 lb

## 2014-08-10 DIAGNOSIS — E669 Obesity, unspecified: Secondary | ICD-10-CM

## 2014-08-10 DIAGNOSIS — E039 Hypothyroidism, unspecified: Secondary | ICD-10-CM

## 2014-08-10 DIAGNOSIS — E038 Other specified hypothyroidism: Secondary | ICD-10-CM

## 2014-08-10 DIAGNOSIS — M545 Low back pain, unspecified: Secondary | ICD-10-CM

## 2014-08-10 DIAGNOSIS — E119 Type 2 diabetes mellitus without complications: Secondary | ICD-10-CM

## 2014-08-10 DIAGNOSIS — T7840XA Allergy, unspecified, initial encounter: Secondary | ICD-10-CM

## 2014-08-10 DIAGNOSIS — M62830 Muscle spasm of back: Secondary | ICD-10-CM

## 2014-08-10 DIAGNOSIS — M5380 Other specified dorsopathies, site unspecified: Secondary | ICD-10-CM

## 2014-08-10 DIAGNOSIS — E785 Hyperlipidemia, unspecified: Secondary | ICD-10-CM

## 2014-08-10 HISTORY — DX: Allergy, unspecified, initial encounter: T78.40XA

## 2014-08-10 HISTORY — DX: Obesity, unspecified: E66.9

## 2014-08-10 LAB — CBC
HCT: 40.7 % (ref 36.0–46.0)
Hemoglobin: 13.8 g/dL (ref 12.0–15.0)
MCH: 30 pg (ref 26.0–34.0)
MCHC: 33.9 g/dL (ref 30.0–36.0)
MCV: 88.5 fL (ref 78.0–100.0)
PLATELETS: 239 10*3/uL (ref 150–400)
RBC: 4.6 MIL/uL (ref 3.87–5.11)
RDW: 13.7 % (ref 11.5–15.5)
WBC: 4.2 10*3/uL (ref 4.0–10.5)

## 2014-08-10 LAB — HEPATIC FUNCTION PANEL
ALT: 35 U/L (ref 0–35)
AST: 44 U/L — ABNORMAL HIGH (ref 0–37)
Albumin: 4.4 g/dL (ref 3.5–5.2)
Alkaline Phosphatase: 67 U/L (ref 39–117)
Bilirubin, Direct: 0.1 mg/dL (ref 0.0–0.3)
Indirect Bilirubin: 0.5 mg/dL (ref 0.2–1.2)
Total Bilirubin: 0.6 mg/dL (ref 0.2–1.2)
Total Protein: 7.7 g/dL (ref 6.0–8.3)

## 2014-08-10 LAB — LIPID PANEL
Cholesterol: 251 mg/dL — ABNORMAL HIGH (ref 0–200)
HDL: 57 mg/dL (ref >39)
LDL Cholesterol: 135 mg/dL — ABNORMAL HIGH (ref 0–99)
TRIGLYCERIDES: 294 mg/dL — AB (ref <150)
Total CHOL/HDL Ratio: 4.4 Ratio
VLDL: 59 mg/dL — ABNORMAL HIGH (ref 0–40)

## 2014-08-10 LAB — RENAL FUNCTION PANEL
Albumin: 4.4 g/dL (ref 3.5–5.2)
BUN: 15 mg/dL (ref 6–23)
CALCIUM: 9.5 mg/dL (ref 8.4–10.5)
CO2: 31 mEq/L (ref 19–32)
Chloride: 96 mEq/L (ref 96–112)
Creat: 0.78 mg/dL (ref 0.50–1.10)
GLUCOSE: 140 mg/dL — AB (ref 70–99)
POTASSIUM: 4.4 meq/L (ref 3.5–5.3)
Phosphorus: 3.7 mg/dL (ref 2.3–4.6)
Sodium: 137 mEq/L (ref 135–145)

## 2014-08-10 LAB — HEMOGLOBIN A1C
Hgb A1c MFr Bld: 7.7 % — ABNORMAL HIGH (ref <5.7)
Mean Plasma Glucose: 174 mg/dL — ABNORMAL HIGH (ref <117)

## 2014-08-10 MED ORDER — LEVOTHYROXINE SODIUM 88 MCG PO TABS: 88.0000 ug | ORAL_TABLET | Freq: Every day | ORAL | Status: DC

## 2014-08-10 MED ORDER — METFORMIN HCL 1000 MG PO TABS: 1000.0000 mg | ORAL_TABLET | Freq: Two times a day (BID) | ORAL | Status: DC

## 2014-08-10 MED ORDER — CYCLOBENZAPRINE HCL 5 MG PO TABS: 5.0000 mg | ORAL_TABLET | Freq: Every evening | ORAL | Status: DC | PRN

## 2014-08-10 MED ORDER — ATORVASTATIN CALCIUM 20 MG PO TABS: 20.0000 mg | ORAL_TABLET | Freq: Every day | ORAL | Status: DC

## 2014-08-10 NOTE — Progress Notes (Signed)
Pre visit review using our clinic review tool, if applicable. No additional management support is needed unless otherwise documented below in the visit note. 

## 2014-08-10 NOTE — Patient Instructions (Signed)
Try a pain patch for back pain such as Salon Pas and let us know if worsens  Take Glucometer to pharmacy and see what one your insurance will pay for. We can send in new prescriptions for test strips, glucometer etc  Basic Carbohydrate Counting for Diabetes Mellitus Carbohydrate counting is a method for keeping track of the amount of carbohydrates you eat. Eating carbohydrates naturally increases the level of sugar (glucose) in your blood, so it is important for you to know the amount that is okay for you to have in every meal. Carbohydrate counting helps keep the level of glucose in your blood within normal limits. The amount of carbohydrates allowed is different for every person. A dietitian can help you calculate the amount that is right for you. Once you know the amount of carbohydrates you can have, you can count the carbohydrates in the foods you want to eat. Carbohydrates are found in the following foods:  Grains, such as breads and cereals.  Dried beans and soy products.  Starchy vegetables, such as potatoes, peas, and corn.  Fruit and fruit juices.  Milk and yogurt.  Sweets and snack foods, such as cake, cookies, candy, chips, soft drinks, and fruit drinks. CARBOHYDRATE COUNTING There are two ways to count the carbohydrates in your food. You can use either of the methods or a combination of both. Reading the "Nutrition Facts" on [place] The "Nutrition Facts" is an area that is included on the labels of almost all packaged food and beverages in the [place]. It includes the serving size of that food or beverage and information about the nutrients in each serving of the food, including the grams (g) of carbohydrate per serving.  Decide the number of servings of this food or beverage that you will be able to eat or drink. Multiply that number of servings by the number of grams of carbohydrate that is listed on the label for that serving. The total will be the amount of  carbohydrates you will be having when you eat or drink this food or beverage. Learning Standard Serving Sizes of Food When you eat food that is not packaged or does not include "Nutrition Facts" on the label, you need to measure the servings in order to count the amount of carbohydrates.A serving of most carbohydrate-rich foods contains about 15 g of carbohydrates. The following list includes serving sizes of carbohydrate-rich foods that provide 15 g ofcarbohydrate per serving:   1 slice of bread (1 oz) or 1 six-inch tortilla.    of a hamburger bun or English muffin.  4-6 crackers.   cup unsweetened dry cereal.    cup hot cereal.   cup rice or pasta.    cup mashed potatoes or  of a large baked potato.  1 cup fresh fruit or one small piece of fruit.    cup canned or frozen fruit or fruit juice.  1 cup milk.   cup plain fat-free yogurt or yogurt sweetened with artificial sweeteners.   cup cooked dried beans or starchy vegetable, such as peas, corn, or potatoes.  Decide the number of standard-size servings that you will eat. Multiply that number of servings by 15 (the grams of carbohydrates in that serving). For example, if you eat 2 cups of strawberries, you will have eaten 2 servings and 30 g of carbohydrates (2 servings x 15 g = 30 g). For foods such as soups and casseroles, in which more than one food is mixed in, you will  need to count the carbohydrates in each food that is included. EXAMPLE OF CARBOHYDRATE COUNTING Sample Dinner  3 oz chicken breast.   cup of brown rice.   cup of corn.  1 cup milk.   1 cup strawberries with sugar-free whipped topping.  Carbohydrate Calculation Step 1: Identify the foods that contain carbohydrates:   Rice.   Corn.   Milk.   Strawberries. Step 2:Calculate the number of servings eaten of each:   2 servings of rice.   1 serving of corn.   1 serving of milk.   1 serving of strawberries. Step 3:  Multiply each of those number of servings by 15 g:   2 servings of rice x 15 g = 30 g.   1 serving of corn x 15 g = 15 g.   1 serving of milk x 15 g = 15 g.   1 serving of strawberries x 15 g = 15 g. Step 4: Add together all of the amounts to find the total grams of carbohydrates eaten: 30 g + 15 g + 15 g + 15 g = 75 g. Document Released: 12/17/2005 Document Revised: 05/03/2014 Document Reviewed: 11/13/2013 [facility] Patient Information 2015 [place], [place]. This information is not intended to replace advice given to you by your health care provider. Make sure you discuss any questions you have with your health care provider.

## 2014-08-11 LAB — TSH: TSH: 1.605 u[IU]/mL (ref 0.350–4.500)

## 2014-08-12 MED ORDER — GLIPIZIDE 5 MG PO TABS: 5.0000 mg | ORAL_TABLET | Freq: Two times a day (BID) | ORAL | Status: DC

## 2014-08-12 MED ORDER — ATORVASTATIN CALCIUM 40 MG PO TABS: 40.0000 mg | ORAL_TABLET | Freq: Every day | ORAL | Status: DC

## 2014-08-15 ENCOUNTER — Encounter: Payer: Self-pay | Admitting: Family Medicine

## 2014-08-15 DIAGNOSIS — E785 Hyperlipidemia, unspecified: Secondary | ICD-10-CM | POA: Insufficient documentation

## 2014-08-15 DIAGNOSIS — M545 Low back pain, unspecified: Secondary | ICD-10-CM | POA: Insufficient documentation

## 2014-08-15 NOTE — Assessment & Plan Note (Signed)
On Levothyroxine, continue to monitor 

## 2014-08-15 NOTE — Assessment & Plan Note (Addendum)
Has not been checking her sugars, denies polyuria or polydipsia. hgba1c acceptable, minimize simple carbs. Increase exercise as tolerated. Continue current meds. Start checking sugars daily and prn. Add Glipizide.

## 2014-08-15 NOTE — Assessment & Plan Note (Signed)
Encouraged DASH diet, decrease po intake and increase exercise as tolerated. Needs 7-8 hours of sleep nightly. Avoid trans fats, eat small, frequent meals every 4-5 hours with lean proteins, complex carbs and healthy fats. Minimize simple carbs, GMO foods. 

## 2014-08-15 NOTE — Progress Notes (Signed)
Patient ID: Jasmine Morse, female   DOB: Nov 23, 1951, 63 y.o.   MRN: [id number] Jasmine Morse [facility] [id number] [birth year] 08/15/2014      Progress Note-Follow Up  Subjective  Chief Complaint  Chief Complaint  Patient presents with  . Medication Refill    HPI  Patient is a 63 year old female in today for routine medical care. Stimulator we will also does note some recent mild nasal congestion and sore throat. Response partially to Benadryl. No fevers or chills. Denies CP/palp/SOB/HA/fevers/GI or GU c/o. Taking meds as prescribed  Past Medical History  Diagnosis Date  . Thyroid disease     hypothyroidism  . Diabetes mellitus     type II  . Cancer     colon cancer stage III (T3, N2b) s/p right hemicolectomy, FOLFOX-- Dr Learta Codding  . Obesity, unspecified 08/10/2014  . Allergic state 08/10/2014    Past Surgical History  Procedure Laterality Date  . Cesarean section      x 3  . Tonsillectomy    . Inner ear surgery      due to numerous ear infections    Family History  Problem Relation Age of Onset  . Cancer Father     colon  . Cancer Brother     colon  . Diabetes Other   . Hypertension Other   . Stroke Other     History   Social History  . Marital Status: Widowed    Spouse Name: N/A    Number of Children: 3  . Years of Education: N/A   Occupational History  . [employer]    Social History Main Topics  . Smoking status: Never Smoker   . Smokeless tobacco: Never Used  . Alcohol Use: No  . Drug Use: No  . Sexual Activity: Not on file   Other Topics Concern  . Not on file   Social History Narrative  . No narrative on file    Current Outpatient Prescriptions on File Prior to Visit  Medication Sig Dispense Refill  . glucose blood (ACCU-CHEK AVIVA) test strip Use to test blood sugar before morning meal.       . Lancets Misc. (ACCU-CHEK MULTICLIX LANCET DEV) KIT Use as directed        No current facility-administered medications on file prior to visit.     Allergies  Allergen Reactions  . Codeine     REACTION: Nausea \T\ Diarrhea    Review of Systems  Review of Systems  Constitutional: Negative for fever and malaise/fatigue.  HENT: Negative for congestion.   Eyes: Negative for discharge.  Respiratory: Negative for shortness of breath.   Cardiovascular: Negative for chest pain, palpitations and leg swelling.  Gastrointestinal: Negative for nausea, abdominal pain and diarrhea.  Genitourinary: Negative for dysuria.  Musculoskeletal: Negative for falls.  Skin: Negative for rash.  Neurological: Negative for loss of consciousness and headaches.  Endo/Heme/Allergies: Negative for polydipsia.  Psychiatric/Behavioral: Negative for depression and suicidal ideas. The patient is not nervous/anxious and does not have insomnia.     Objective  BP 130/82  Pulse 69  Temp(Src) 98.2 F (36.8 C) (Oral)  Ht 5' 3"  (1.6 m)  Wt 208 lb 1.3 oz (94.384 kg)  BMI 36.87 kg/m2  SpO2 96%  Physical Exam  Physical Exam  Constitutional: She is oriented to person, place, and time and well-developed, well-nourished, and in no distress. No distress.  HENT:  Head: Normocephalic and atraumatic.  Eyes: Conjunctivae are normal.  Neck: Neck supple. No  thyromegaly present.  Cardiovascular: Normal rate, regular rhythm and normal heart sounds.   No murmur heard. Pulmonary/Chest: Effort normal and breath sounds normal. She has no wheezes.  Abdominal: She exhibits no distension and no mass.  Musculoskeletal: She exhibits no edema.  Lymphadenopathy:    She has no cervical adenopathy.  Neurological: She is alert and oriented to person, place, and time.  Skin: Skin is warm and dry. No rash noted. She is not diaphoretic.  Psychiatric: Memory, affect and judgment normal.    Lab Results  Component Value Date   TSH 1.605 08/10/2014   Lab Results  Component Value Date   WBC 4.2 08/10/2014   HGB 13.8 08/10/2014   HCT 40.7 08/10/2014   MCV 88.5 08/10/2014   PLT  239 08/10/2014   Lab Results  Component Value Date   CREATININE 0.78 08/10/2014   BUN 15 08/10/2014   NA 137 08/10/2014   K 4.4 08/10/2014   CL 96 08/10/2014   CO2 31 08/10/2014   Lab Results  Component Value Date   ALT 35 08/10/2014   AST 44* 08/10/2014   ALKPHOS 67 08/10/2014   BILITOT 0.6 08/10/2014   Lab Results  Component Value Date   CHOL 251* 08/10/2014   Lab Results  Component Value Date   HDL 57 08/10/2014   Lab Results  Component Value Date   LDLCALC 135* 08/10/2014   Lab Results  Component Value Date   TRIG 294* 08/10/2014   Lab Results  Component Value Date   CHOLHDL 4.4 08/10/2014     Assessment & Plan  DIABETES-TYPE 2 Has not been checking her sugars, denies polyuria or polydipsia. hgba1c acceptable, minimize simple carbs. Increase exercise as tolerated. Continue current meds. Start checking sugars daily and prn. Add Glipizide.  HYPOTHYROIDISM On Levothyroxine, continue to monitor  Obesity, unspecified Encouraged DASH diet, decrease po intake and increase exercise as tolerated. Needs 7-8 hours of sleep nightly. Avoid trans fats, eat small, frequent meals every 4-5 hours with lean proteins, complex carbs and healthy fats. Minimize simple carbs, GMO foods.  Muscle spasm of back Encouraged moist heat and gentle stretching as tolerated. May try NSAIDs and prescription meds as directed and report if symptoms worsen or seek immediate care  Hyperlipidemia Tolerating statin, encouraged heart healthy diet, avoid trans fats, minimize simple carbs and saturated fats. Increase exercise as tolerated

## 2014-08-15 NOTE — Assessment & Plan Note (Signed)
Encouraged moist heat and gentle stretching as tolerated. May try NSAIDs and prescription meds as directed and report if symptoms worsen or seek immediate care 

## 2014-08-15 NOTE — Assessment & Plan Note (Signed)
Tolerating statin, encouraged heart healthy diet, avoid trans fats, minimize simple carbs and saturated fats. Increase exercise as tolerated 

## 2015-01-20 ENCOUNTER — Encounter: Payer: BC Managed Care – PPO | Admitting: Family Medicine

## 2015-06-24 ENCOUNTER — Encounter: Payer: Self-pay | Admitting: Family Medicine

## 2015-06-24 ENCOUNTER — Ambulatory Visit (INDEPENDENT_AMBULATORY_CARE_PROVIDER_SITE_OTHER): Payer: BC Managed Care – PPO | Admitting: Family Medicine

## 2015-06-24 VITALS — BP 120/88 | HR 75 | Temp 97.9°F | Ht 63.0 in | Wt 202.5 lb

## 2015-06-24 DIAGNOSIS — E039 Hypothyroidism, unspecified: Secondary | ICD-10-CM | POA: Diagnosis not present

## 2015-06-24 DIAGNOSIS — E669 Obesity, unspecified: Secondary | ICD-10-CM | POA: Diagnosis not present

## 2015-06-24 DIAGNOSIS — E1169 Type 2 diabetes mellitus with other specified complication: Secondary | ICD-10-CM

## 2015-06-24 DIAGNOSIS — E119 Type 2 diabetes mellitus without complications: Secondary | ICD-10-CM

## 2015-06-24 DIAGNOSIS — E785 Hyperlipidemia, unspecified: Secondary | ICD-10-CM

## 2015-06-24 DIAGNOSIS — L659 Nonscarring hair loss, unspecified: Secondary | ICD-10-CM

## 2015-06-24 LAB — CBC
HCT: 42 % (ref 36.0–46.0)
Hemoglobin: 14 g/dL (ref 12.0–15.0)
MCH: 29.7 pg (ref 26.0–34.0)
MCHC: 33.3 g/dL (ref 30.0–36.0)
MCV: 89.2 fL (ref 78.0–100.0)
MPV: 10.5 fL (ref 8.6–12.4)
PLATELETS: 272 10*3/uL (ref 150–400)
RBC: 4.71 MIL/uL (ref 3.87–5.11)
RDW: 13.6 % (ref 11.5–15.5)
WBC: 5.3 10*3/uL (ref 4.0–10.5)

## 2015-06-24 LAB — HEMOGLOBIN A1C
Hgb A1c MFr Bld: 7.4 % — ABNORMAL HIGH (ref <5.7)
Mean Plasma Glucose: 166 mg/dL — ABNORMAL HIGH (ref <117)

## 2015-06-24 NOTE — Patient Instructions (Signed)
For hair and nails add Zinc 50 mg daily, krill or fish oil caps and Biotin A probiotic daily such Digestive Advantage or [place]  Basic Carbohydrate Counting for Diabetes Mellitus Carbohydrate counting is a method for keeping track of the amount of carbohydrates you eat. Eating carbohydrates naturally increases the level of sugar (glucose) in your blood, so it is important for you to know the amount that is okay for you to have in every meal. Carbohydrate counting helps keep the level of glucose in your blood within normal limits. The amount of carbohydrates allowed is different for every person. A dietitian can help you calculate the amount that is right for you. Once you know the amount of carbohydrates you can have, you can count the carbohydrates in the foods you want to eat. Carbohydrates are found in the following foods:  Grains, such as breads and cereals.  Dried beans and soy products.  Starchy vegetables, such as potatoes, peas, and corn.  Fruit and fruit juices.  Milk and yogurt.  Sweets and snack foods, such as cake, cookies, candy, chips, soft drinks, and fruit drinks. CARBOHYDRATE COUNTING There are two ways to count the carbohydrates in your food. You can use either of the methods or a combination of both. Reading the "Nutrition Facts" on [place] The "Nutrition Facts" is an area that is included on the labels of almost all packaged food and beverages in the [place]. It includes the serving size of that food or beverage and information about the nutrients in each serving of the food, including the grams (g) of carbohydrate per serving.  Decide the number of servings of this food or beverage that you will be able to eat or drink. Multiply that number of servings by the number of grams of carbohydrate that is listed on the label for that serving. The total will be the amount of carbohydrates you will be having when you eat or drink this food or  beverage. Learning Standard Serving Sizes of Food When you eat food that is not packaged or does not include "Nutrition Facts" on the label, you need to measure the servings in order to count the amount of carbohydrates.A serving of most carbohydrate-rich foods contains about 15 g of carbohydrates. The following list includes serving sizes of carbohydrate-rich foods that provide 15 g ofcarbohydrate per serving:   1 slice of bread (1 oz) or 1 six-inch tortilla.    of a hamburger bun or English muffin.  4-6 crackers.   cup unsweetened dry cereal.    cup hot cereal.   cup rice or pasta.    cup mashed potatoes or  of a large baked potato.  1 cup fresh fruit or one small piece of fruit.    cup canned or frozen fruit or fruit juice.  1 cup milk.   cup plain fat-free yogurt or yogurt sweetened with artificial sweeteners.   cup cooked dried beans or starchy vegetable, such as peas, corn, or potatoes.  Decide the number of standard-size servings that you will eat. Multiply that number of servings by 15 (the grams of carbohydrates in that serving). For example, if you eat 2 cups of strawberries, you will have eaten 2 servings and 30 g of carbohydrates (2 servings x 15 g = 30 g). For foods such as soups and casseroles, in which more than one food is mixed in, you will need to count the carbohydrates in each food that is included. EXAMPLE OF CARBOHYDRATE COUNTING Sample  Dinner  3 oz chicken breast.   cup of brown rice.   cup of corn.  1 cup milk.   1 cup strawberries with sugar-free whipped topping.  Carbohydrate Calculation Step 1: Identify the foods that contain carbohydrates:   Rice.   Corn.   Milk.   Strawberries. Step 2:Calculate the number of servings eaten of each:   2 servings of rice.   1 serving of corn.   1 serving of milk.   1 serving of strawberries. Step 3: Multiply each of those number of servings by 15 g:   2 servings of  rice x 15 g = 30 g.   1 serving of corn x 15 g = 15 g.   1 serving of milk x 15 g = 15 g.   1 serving of strawberries x 15 g = 15 g. Step 4: Add together all of the amounts to find the total grams of carbohydrates eaten: 30 g + 15 g + 15 g + 15 g = 75 g. Document Released: 12/17/2005 Document Revised: 05/03/2014 Document Reviewed: 11/13/2013 [facility] Patient Information 2015 [place], [place]. This information is not intended to replace advice given to you by your health care provider. Make sure you discuss any questions you have with your health care provider.

## 2015-06-24 NOTE — Progress Notes (Signed)
Pre visit review using our clinic review tool, if applicable. No additional management support is needed unless otherwise documented below in the visit note. 

## 2015-06-25 LAB — COMPREHENSIVE METABOLIC PANEL
ALK PHOS: 67 U/L (ref 39–117)
ALT: 17 U/L (ref 0–35)
AST: 19 U/L (ref 0–37)
Albumin: 4.3 g/dL (ref 3.5–5.2)
BILIRUBIN TOTAL: 0.4 mg/dL (ref 0.2–1.2)
BUN: 17 mg/dL (ref 6–23)
CO2: 27 mEq/L (ref 19–32)
Calcium: 9.7 mg/dL (ref 8.4–10.5)
Chloride: 99 mEq/L (ref 96–112)
Creat: 0.75 mg/dL (ref 0.50–1.10)
Glucose, Bld: 154 mg/dL — ABNORMAL HIGH (ref 70–99)
Potassium: 4.4 mEq/L (ref 3.5–5.3)
SODIUM: 140 meq/L (ref 135–145)
TOTAL PROTEIN: 7.6 g/dL (ref 6.0–8.3)

## 2015-06-25 LAB — LIPID PANEL
CHOLESTEROL: 263 mg/dL — AB (ref 0–200)
HDL: 51 mg/dL (ref >=46)
Total CHOL/HDL Ratio: 5.2 Ratio
Triglycerides: 545 mg/dL — ABNORMAL HIGH (ref <150)

## 2015-06-25 LAB — TSH: TSH: 1.253 u[IU]/mL (ref 0.350–4.500)

## 2015-06-27 ENCOUNTER — Other Ambulatory Visit: Payer: Self-pay | Admitting: Family Medicine

## 2015-06-27 MED ORDER — GLIPIZIDE 5 MG PO TABS: 5.0000 mg | ORAL_TABLET | Freq: Every day | ORAL | Status: DC

## 2015-06-27 MED ORDER — ATORVASTATIN CALCIUM 10 MG PO TABS: 10.0000 mg | ORAL_TABLET | Freq: Every day | ORAL | Status: DC

## 2015-07-10 ENCOUNTER — Encounter: Payer: Self-pay | Admitting: Family Medicine

## 2015-07-10 DIAGNOSIS — L659 Nonscarring hair loss, unspecified: Secondary | ICD-10-CM

## 2015-07-10 HISTORY — DX: Nonscarring hair loss, unspecified: L65.9

## 2015-07-10 NOTE — Assessment & Plan Note (Signed)
Encouraged DASH diet, decrease po intake and increase exercise as tolerated. Needs 7-8 hours of sleep nightly. Avoid trans fats, eat small, frequent meals every 4-5 hours with lean proteins, complex carbs and healthy fats. Minimize simple carbs, GMO foods. 

## 2015-07-10 NOTE — Assessment & Plan Note (Signed)
Start Atorvastatin 10 mg daily. Encouraged heart healthy diet, increase exercise, avoid trans fats, consider a krill oil cap daily

## 2015-07-10 NOTE — Progress Notes (Signed)
Jasmine Morse [facility] [id number] 12/28/1951 07/10/2015      Progress Note New Patient  Subjective  Chief Complaint  Chief Complaint  Patient presents with  . Follow-up    HPI  Patient is a 64 year old female in today for routine medical care. She is in today for follow-up on numerous medical problems. Has only been taking her metformin and her thyroid medication. Denies any recent illness. Denies any polyuria or polydipsia. Has been trying to maintain a heart healthy diet. Her major complaint today is of some recent hair loss and thinning and chipping of her fingernails. Denies recent fevers. Denies CP/palp/SOB/HA/congestion/fevers/GI or GU c/o. Taking meds as prescribed  Past Medical History  Diagnosis Date  . Thyroid disease     hypothyroidism  . Diabetes mellitus     type II  . Cancer     colon cancer stage III (T3, N2b) s/p right hemicolectomy, FOLFOX-- Dr Learta Codding  . Obesity, unspecified 08/10/2014  . Allergic state 08/10/2014    Past Surgical History  Procedure Laterality Date  . Cesarean section      x 3  . Tonsillectomy    . Inner ear surgery      due to numerous ear infections    Family History  Problem Relation Age of Onset  . Cancer Father     colon  . Cancer Brother     colon  . Diabetes Other   . Hypertension Other   . Stroke Other     History   Social History  . Marital Status: Widowed    Spouse Name: N/A  . Number of Children: 3  . Years of Education: N/A   Occupational History  . [employer]    Social History Main Topics  . Smoking status: Never Smoker   . Smokeless tobacco: Never Used  . Alcohol Use: No  . Drug Use: No  . Sexual Activity: Not on file   Other Topics Concern  . Not on file   Social History Narrative    Current Outpatient Prescriptions on File Prior to Visit  Medication Sig Dispense Refill  . levothyroxine (SYNTHROID, LEVOTHROID) 88 MCG tablet Take 1 tablet (88 mcg total) by mouth daily before breakfast. 90 tablet 1  .  metFORMIN (GLUCOPHAGE) 1000 MG tablet Take 1 tablet (1,000 mg total) by mouth 2 (two) times daily with a meal. 180 tablet 1  . glucose blood (ACCU-CHEK AVIVA) test strip Use to test blood sugar before morning meal.     . Lancets Misc. (ACCU-CHEK MULTICLIX LANCET DEV) KIT Use as directed      No current facility-administered medications on file prior to visit.    Allergies  Allergen Reactions  . Codeine     REACTION: Nausea \T\ Diarrhea    Review of Systems  Review of Systems  Constitutional: Negative for fever and malaise/fatigue.  HENT: Negative for congestion.   Eyes: Negative for discharge.  Respiratory: Negative for shortness of breath.   Cardiovascular: Negative for chest pain, palpitations and leg swelling.  Gastrointestinal: Negative for nausea, abdominal pain and diarrhea.  Genitourinary: Negative for dysuria.  Musculoskeletal: Negative for falls.  Skin: Negative for rash.  Neurological: Negative for loss of consciousness and headaches.  Endo/Heme/Allergies: Negative for polydipsia.  Psychiatric/Behavioral: Negative for depression and suicidal ideas. The patient is not nervous/anxious and does not have insomnia.     Objective  BP 120/88 mmHg  Pulse 75  Temp(Src) 97.9 F (36.6 C) (Oral)  Ht 5' 3"  (1.6 m)  Wt 202 lb 8 oz (91.853 kg)  BMI 35.88 kg/m2  SpO2 97%  Physical Exam  Physical Exam  Constitutional: She is oriented to person, place, and time and well-developed, well-nourished, and in no distress. No distress.  HENT:  Head: Normocephalic and atraumatic.  Eyes: Conjunctivae are normal.  Neck: Neck supple. No thyromegaly present.  Cardiovascular: Normal rate, regular rhythm and normal heart sounds.   No murmur heard. Pulmonary/Chest: Effort normal and breath sounds normal. She has no wheezes.  Abdominal: She exhibits no distension and no mass.  Musculoskeletal: She exhibits no edema.  Lymphadenopathy:    She has no cervical adenopathy.  Neurological:  She is alert and oriented to person, place, and time.  Skin: Skin is warm and dry. No rash noted. She is not diaphoretic.  Psychiatric: Memory, affect and judgment normal.       Assessment & Plan  Diabetes mellitus type 2 in obese hgba1c acceptable still slightly elevated minimize simple carbs. Increase exercise as tolerated. Continue current meds  Hypothyroidism On Levothyroxine, continue to monitor  Hyperlipidemia Start Atorvastatin 10 mg daily. Encouraged heart healthy diet, increase exercise, avoid trans fats, consider a krill oil cap daily  Obesity Encouraged DASH diet, decrease po intake and increase exercise as tolerated. Needs 7-8 hours of sleep nightly. Avoid trans fats, eat small, frequent meals every 4-5 hours with lean proteins, complex carbs and healthy fats. Minimize simple carbs, GMO foods.  Hair loss Chipping nails. Encouraged Biotin, zinc and fatty acid supplements along with heart healthy diet

## 2015-07-10 NOTE — Assessment & Plan Note (Signed)
Chipping nails. Encouraged Biotin, zinc and fatty acid supplements along with heart healthy diet

## 2015-07-10 NOTE — Assessment & Plan Note (Signed)
hgba1c acceptable still slightly elevated minimize simple carbs. Increase exercise as tolerated. Continue current meds

## 2015-07-10 NOTE — Assessment & Plan Note (Signed)
On Levothyroxine, continue to monitor 

## 2015-07-12 ENCOUNTER — Encounter: Payer: Self-pay | Admitting: Genetic Counselor

## 2015-09-22 ENCOUNTER — Telehealth: Payer: Self-pay | Admitting: Family Medicine

## 2015-09-22 NOTE — Telephone Encounter (Signed)
VM 9/22 2:05pm to schedule appt, called and left msg for pt to call office and schedule

## 2015-10-07 ENCOUNTER — Telehealth: Payer: Self-pay | Admitting: Family Medicine

## 2015-10-07 DIAGNOSIS — E669 Obesity, unspecified: Secondary | ICD-10-CM

## 2015-10-07 DIAGNOSIS — T7840XA Allergy, unspecified, initial encounter: Secondary | ICD-10-CM

## 2015-10-07 DIAGNOSIS — E785 Hyperlipidemia, unspecified: Secondary | ICD-10-CM

## 2015-10-07 DIAGNOSIS — E038 Other specified hypothyroidism: Secondary | ICD-10-CM

## 2015-10-07 MED ORDER — LEVOTHYROXINE SODIUM 88 MCG PO TABS
88.0000 ug | ORAL_TABLET | Freq: Every day | ORAL | Status: DC
Start: 2015-10-07 — End: 2016-01-16

## 2015-10-07 NOTE — Telephone Encounter (Signed)
Pharmacy: [facility], [place] - [address]  Reason for call: Pt needs levothyroxine. She has been out 2 days. Scheduled f/u visit for 11/04/15.

## 2015-10-07 NOTE — Telephone Encounter (Signed)
Patient informed and prescription sent in.

## 2015-11-04 ENCOUNTER — Telehealth: Payer: Self-pay | Admitting: Family Medicine

## 2015-11-04 ENCOUNTER — Ambulatory Visit: Payer: BC Managed Care – PPO | Admitting: Family Medicine

## 2015-11-04 NOTE — Telephone Encounter (Signed)
Patient called to reschedule her appt

## 2015-11-04 NOTE — Telephone Encounter (Signed)
Patient scheduled for 01/16/2016

## 2015-11-04 NOTE — Telephone Encounter (Signed)
Please send request to one of our schedulers, thanks

## 2016-01-04 ENCOUNTER — Telehealth: Payer: Self-pay | Admitting: Behavioral Health

## 2016-01-04 NOTE — Telephone Encounter (Signed)
Assessed the following care gaps: Microalbumin Mammogram Pap Flu  Patient has a prior appointment scheduled for 01/16/16. Attempted to reach patient to schedule for an annual physical instead, to close the above gaps in care. Left message for patient to return call when available.

## 2016-01-16 ENCOUNTER — Encounter: Payer: Self-pay | Admitting: Family Medicine

## 2016-01-16 ENCOUNTER — Ambulatory Visit (INDEPENDENT_AMBULATORY_CARE_PROVIDER_SITE_OTHER): Payer: BC Managed Care – PPO | Admitting: Family Medicine

## 2016-01-16 VITALS — BP 132/80 | HR 75 | Temp 98.4°F | Ht 63.0 in | Wt 211.0 lb

## 2016-01-16 DIAGNOSIS — E038 Other specified hypothyroidism: Secondary | ICD-10-CM

## 2016-01-16 DIAGNOSIS — E669 Obesity, unspecified: Secondary | ICD-10-CM | POA: Diagnosis not present

## 2016-01-16 DIAGNOSIS — E785 Hyperlipidemia, unspecified: Secondary | ICD-10-CM | POA: Diagnosis not present

## 2016-01-16 DIAGNOSIS — E119 Type 2 diabetes mellitus without complications: Secondary | ICD-10-CM

## 2016-01-16 DIAGNOSIS — E1169 Type 2 diabetes mellitus with other specified complication: Secondary | ICD-10-CM

## 2016-01-16 DIAGNOSIS — T7840XA Allergy, unspecified, initial encounter: Secondary | ICD-10-CM

## 2016-01-16 MED ORDER — GLIPIZIDE 5 MG PO TABS: 5.0000 mg | ORAL_TABLET | Freq: Every day | ORAL | Status: DC

## 2016-01-16 MED ORDER — METFORMIN HCL 1000 MG PO TABS: 1000.0000 mg | ORAL_TABLET | Freq: Two times a day (BID) | ORAL | Status: DC

## 2016-01-16 MED ORDER — LEVOTHYROXINE SODIUM 88 MCG PO TABS: 88.0000 ug | ORAL_TABLET | Freq: Every day | ORAL | Status: DC

## 2016-01-16 NOTE — Patient Instructions (Addendum)
Call insurance to see if they will cover eye exam. Doctor recommends diabetic eye exam screening.  Basic Carbohydrate Counting for Diabetes Mellitus Carbohydrate counting is a method for keeping track of the amount of carbohydrates you eat. Eating carbohydrates naturally increases the level of sugar (glucose) in your blood, so it is important for you to know the amount that is okay for you to have in every meal. Carbohydrate counting helps keep the level of glucose in your blood within normal limits. The amount of carbohydrates allowed is different for every person. A dietitian can help you calculate the amount that is right for you. Once you know the amount of carbohydrates you can have, you can count the carbohydrates in the foods you want to eat. Carbohydrates are found in the following foods:  Grains, such as breads and cereals.  Dried beans and soy products.  Starchy vegetables, such as potatoes, peas, and corn.  Fruit and fruit juices.  Milk and yogurt.  Sweets and snack foods, such as cake, cookies, candy, chips, soft drinks, and fruit drinks. CARBOHYDRATE COUNTING There are two ways to count the carbohydrates in your food. You can use either of the methods or a combination of both. Reading the "Nutrition Facts" on [place] The "Nutrition Facts" is an area that is included on the labels of almost all packaged food and beverages in the [place]. It includes the serving size of that food or beverage and information about the nutrients in each serving of the food, including the grams (g) of carbohydrate per serving.  Decide the number of servings of this food or beverage that you will be able to eat or drink. Multiply that number of servings by the number of grams of carbohydrate that is listed on the label for that serving. The total will be the amount of carbohydrates you will be having when you eat or drink this food or beverage. Learning Standard Serving Sizes of Food When  you eat food that is not packaged or does not include "Nutrition Facts" on the label, you need to measure the servings in order to count the amount of carbohydrates.A serving of most carbohydrate-rich foods contains about 15 g of carbohydrates. The following list includes serving sizes of carbohydrate-rich foods that provide 15 g ofcarbohydrate per serving:   1 slice of bread (1 oz) or 1 six-inch tortilla.    of a hamburger bun or English muffin.  4-6 crackers.   cup unsweetened dry cereal.    cup hot cereal.   cup rice or pasta.    cup mashed potatoes or  of a large baked potato.  1 cup fresh fruit or one small piece of fruit.    cup canned or frozen fruit or fruit juice.  1 cup milk.   cup plain fat-free yogurt or yogurt sweetened with artificial sweeteners.   cup cooked dried beans or starchy vegetable, such as peas, corn, or potatoes.  Decide the number of standard-size servings that you will eat. Multiply that number of servings by 15 (the grams of carbohydrates in that serving). For example, if you eat 2 cups of strawberries, you will have eaten 2 servings and 30 g of carbohydrates (2 servings x 15 g = 30 g). For foods such as soups and casseroles, in which more than one food is mixed in, you will need to count the carbohydrates in each food that is included. EXAMPLE OF CARBOHYDRATE COUNTING Sample Dinner  3 oz chicken breast.   cup of  brown rice.   cup of corn.  1 cup milk.   1 cup strawberries with sugar-free whipped topping.  Carbohydrate Calculation Step 1: Identify the foods that contain carbohydrates:   Rice.   Corn.   Milk.   Strawberries. Step 2:Calculate the number of servings eaten of each:   2 servings of rice.   1 serving of corn.   1 serving of milk.   1 serving of strawberries. Step 3: Multiply each of those number of servings by 15 g:   2 servings of rice x 15 g = 30 g.   1 serving of corn x 15 g = 15 g.    1 serving of milk x 15 g = 15 g.   1 serving of strawberries x 15 g = 15 g. Step 4: Add together all of the amounts to find the total grams of carbohydrates eaten: 30 g + 15 g + 15 g + 15 g = 75 g.   This information is not intended to replace advice given to you by your health care provider. Make sure you discuss any questions you have with your health care provider.   Document Released: 12/17/2005 Document Revised: 01/07/2015 Document Reviewed: 11/13/2013 Elsevier Interactive Patient Education [employer].

## 2016-01-16 NOTE — Progress Notes (Signed)
Subjective:    Patient ID: Jasmine Morse, female    DOB: 04/14/1951, 65 y.o.   MRN: [id number]  Chief Complaint  Patient presents with  . Follow-up    medication    HPI Patient is in today for medication refills and follow-up. She denies recent illness. She denies polyuria or polydipsia. No acute concerns. Denies CP/palp/SOB/HA/congestion/fevers/GI or GU c/o. Taking meds as prescribed  Past Medical History  Diagnosis Date  . Thyroid disease     hypothyroidism  . Diabetes mellitus     type II  . Cancer ([place])     colon cancer stage III (T3, N2b) s/p right hemicolectomy, FOLFOX-- Dr Learta Codding  . Obesity, unspecified 08/10/2014  . Allergic state 08/10/2014  . Hair loss 07/10/2015    Past Surgical History  Procedure Laterality Date  . Cesarean section      x 3  . Tonsillectomy    . Inner ear surgery      due to numerous ear infections    Family History  Problem Relation Age of Onset  . Cancer Father     colon  . Cancer Brother     colon  . Diabetes Other   . Hypertension Other   . Stroke Other     Social History   Social History  . Marital Status: Widowed    Spouse Name: N/A  . Number of Children: 3  . Years of Education: N/A   Occupational History  . [employer]    Social History Main Topics  . Smoking status: Never Smoker   . Smokeless tobacco: Never Used  . Alcohol Use: No  . Drug Use: No  . Sexual Activity: Not on file   Other Topics Concern  . Not on file   Social History Narrative    Outpatient Prescriptions Prior to Visit  Medication Sig Dispense Refill  . atorvastatin (LIPITOR) 10 MG tablet Take 1 tablet (10 mg total) by mouth daily. 30 tablet 6  . glucose blood (ACCU-CHEK AVIVA) test strip Use to test blood sugar before morning meal.     . Lancets Misc. (ACCU-CHEK MULTICLIX LANCET DEV) KIT Use as directed     . glipiZIDE (GLUCOTROL) 5 MG tablet Take 1 tablet (5 mg total) by mouth daily. 30 tablet 6  . levothyroxine (SYNTHROID, LEVOTHROID)  88 MCG tablet Take 1 tablet (88 mcg total) by mouth daily before breakfast. 90 tablet 1  . metFORMIN (GLUCOPHAGE) 1000 MG tablet Take 1 tablet (1,000 mg total) by mouth 2 (two) times daily with a meal. 180 tablet 1   No facility-administered medications prior to visit.    Allergies  Allergen Reactions  . Codeine     REACTION: Nausea \T\ Diarrhea    Review of Systems  Constitutional: Negative for fever and malaise/fatigue.  HENT: Negative for congestion.   Eyes: Negative for discharge.  Respiratory: Negative for shortness of breath.   Cardiovascular: Negative for chest pain, palpitations and leg swelling.  Gastrointestinal: Negative for nausea and abdominal pain.  Genitourinary: Negative for dysuria.  Musculoskeletal: Negative for falls.  Skin: Negative for rash.  Neurological: Negative for loss of consciousness and headaches.  Endo/Heme/Allergies: Negative for environmental allergies.  Psychiatric/Behavioral: Negative for depression. The patient is not nervous/anxious.        Objective:    Physical Exam  Constitutional: She is oriented to person, place, and time. She appears well-developed and well-nourished. No distress.  HENT:  Head: Normocephalic and atraumatic.  Nose: Nose normal.  Eyes:  Right eye exhibits no discharge. Left eye exhibits no discharge.  Neck: Normal range of motion. Neck supple.  Cardiovascular: Normal rate and regular rhythm.   No murmur heard. Pulmonary/Chest: Effort normal and breath sounds normal.  Abdominal: Soft. Bowel sounds are normal. There is no tenderness.  Musculoskeletal: She exhibits no edema.  Neurological: She is alert and oriented to person, place, and time.  Skin: Skin is warm and dry.  Psychiatric: She has a normal mood and affect.  Nursing note and vitals reviewed.   BP 132/80 mmHg  Pulse 75  Temp(Src) 98.4 F (36.9 C) (Oral)  Ht 5' 3"  (1.6 m)  Wt 211 lb (95.709 kg)  BMI 37.39 kg/m2  SpO2 96% Wt Readings from Last 3  Encounters:  01/16/16 211 lb (95.709 kg)  06/24/15 202 lb 8 oz (91.853 kg)  08/10/14 208 lb 1.3 oz (94.384 kg)     Lab Results  Component Value Date   WBC 5.3 06/24/2015   HGB 14.0 06/24/2015   HCT 42.0 06/24/2015   PLT 272 06/24/2015   GLUCOSE 154* 06/24/2015   CHOL 263* 06/24/2015   TRIG 545* 06/24/2015   HDL 51 06/24/2015   LDLCALC NOT CALC 06/24/2015   ALT 17 06/24/2015   AST 19 06/24/2015   NA 140 06/24/2015   K 4.4 06/24/2015   CL 99 06/24/2015   CREATININE 0.75 06/24/2015   BUN 17 06/24/2015   CO2 27 06/24/2015   TSH 1.253 06/24/2015   HGBA1C 7.4* 06/24/2015   MICROALBUR 0.64 07/29/2012    Lab Results  Component Value Date   TSH 1.253 06/24/2015   Lab Results  Component Value Date   WBC 5.3 06/24/2015   HGB 14.0 06/24/2015   HCT 42.0 06/24/2015   MCV 89.2 06/24/2015   PLT 272 06/24/2015   Lab Results  Component Value Date   NA 140 06/24/2015   K 4.4 06/24/2015   CO2 27 06/24/2015   GLUCOSE 154* 06/24/2015   BUN 17 06/24/2015   CREATININE 0.75 06/24/2015   BILITOT 0.4 06/24/2015   ALKPHOS 67 06/24/2015   AST 19 06/24/2015   ALT 17 06/24/2015   PROT 7.6 06/24/2015   ALBUMIN 4.3 06/24/2015   CALCIUM 9.7 06/24/2015   Lab Results  Component Value Date   CHOL 263* 06/24/2015   Lab Results  Component Value Date   HDL 51 06/24/2015   Lab Results  Component Value Date   LDLCALC NOT CALC 06/24/2015   Lab Results  Component Value Date   TRIG 545* 06/24/2015   Lab Results  Component Value Date   CHOLHDL 5.2 06/24/2015   Lab Results  Component Value Date   HGBA1C 7.4* 06/24/2015       Assessment & Plan:   Problem List Items Addressed This Visit    Allergic state   Relevant Medications   levothyroxine (SYNTHROID, LEVOTHROID) 88 MCG tablet   metFORMIN (GLUCOPHAGE) 1000 MG tablet   Diabetes mellitus type 2 in obese (HCC)    hgba1c acceptable, minimize simple carbs. Increase exercise as tolerated. Continue current meds       Relevant Medications   metFORMIN (GLUCOPHAGE) 1000 MG tablet   glipiZIDE (GLUCOTROL) 5 MG tablet   Other Relevant Orders   Hemoglobin A1c   Microalbumin / creatinine urine ratio   Microalbumin / creatinine urine ratio   Hemoglobin A1c   Hyperlipidemia    Tolerating statin, encouraged heart healthy diet, avoid trans fats, minimize simple carbs and saturated fats. Increase exercise as tolerated  Relevant Medications   levothyroxine (SYNTHROID, LEVOTHROID) 88 MCG tablet   metFORMIN (GLUCOPHAGE) 1000 MG tablet   Other Relevant Orders   Lipid panel   Lipid panel   Hypothyroidism - Primary    On Levothyroxine, continue to monitor      Relevant Medications   levothyroxine (SYNTHROID, LEVOTHROID) 88 MCG tablet   metFORMIN (GLUCOPHAGE) 1000 MG tablet   Other Relevant Orders   TSH   TSH   Obesity    Encouraged DASH diet, decrease po intake and increase exercise as tolerated. Needs 7-8 hours of sleep nightly. Avoid trans fats, eat small, frequent meals every 4-5 hours with lean proteins, complex carbs and healthy fats. Minimize simple carbs      Relevant Medications   levothyroxine (SYNTHROID, LEVOTHROID) 88 MCG tablet   metFORMIN (GLUCOPHAGE) 1000 MG tablet   glipiZIDE (GLUCOTROL) 5 MG tablet    Other Visit Diagnoses    Obesity, unspecified        Relevant Medications    levothyroxine (SYNTHROID, LEVOTHROID) 88 MCG tablet    metFORMIN (GLUCOPHAGE) 1000 MG tablet    glipiZIDE (GLUCOTROL) 5 MG tablet    Other Relevant Orders    CBC    Comprehensive metabolic panel    CBC    Comprehensive metabolic panel       I am having Ms. Rivenbark maintain her glucose blood, ACCU-CHEK MULTICLIX LANCET DEV, atorvastatin, levothyroxine, metFORMIN, and glipiZIDE.  Meds ordered this encounter  Medications  . levothyroxine (SYNTHROID, LEVOTHROID) 88 MCG tablet    Sig: Take 1 tablet (88 mcg total) by mouth daily before breakfast.    Dispense:  90 tablet    Refill:  1  . metFORMIN  (GLUCOPHAGE) 1000 MG tablet    Sig: Take 1 tablet (1,000 mg total) by mouth 2 (two) times daily with a meal.    Dispense:  180 tablet    Refill:  1  . glipiZIDE (GLUCOTROL) 5 MG tablet    Sig: Take 1 tablet (5 mg total) by mouth daily.    Dispense:  90 tablet    Refill:  1     Willette Alma, MD

## 2016-01-16 NOTE — Progress Notes (Signed)
Pre visit review using our clinic review tool, if applicable. No additional management support is needed unless otherwise documented below in the visit note. 

## 2016-01-22 NOTE — Assessment & Plan Note (Signed)
Encouraged DASH diet, decrease po intake and increase exercise as tolerated. Needs 7-8 hours of sleep nightly. Avoid trans fats, eat small, frequent meals every 4-5 hours with lean proteins, complex carbs and healthy fats. Minimize simple carbs 

## 2016-01-22 NOTE — Assessment & Plan Note (Signed)
On Levothyroxine, continue to monitor 

## 2016-01-22 NOTE — Assessment & Plan Note (Signed)
hgba1c acceptable, minimize simple carbs. Increase exercise as tolerated. Continue current meds 

## 2016-01-22 NOTE — Assessment & Plan Note (Signed)
Tolerating statin, encouraged heart healthy diet, avoid trans fats, minimize simple carbs and saturated fats. Increase exercise as tolerated 

## 2016-01-24 ENCOUNTER — Other Ambulatory Visit (INDEPENDENT_AMBULATORY_CARE_PROVIDER_SITE_OTHER): Payer: BC Managed Care – PPO

## 2016-01-24 DIAGNOSIS — E038 Other specified hypothyroidism: Secondary | ICD-10-CM

## 2016-01-24 DIAGNOSIS — E1169 Type 2 diabetes mellitus with other specified complication: Secondary | ICD-10-CM

## 2016-01-24 DIAGNOSIS — E785 Hyperlipidemia, unspecified: Secondary | ICD-10-CM

## 2016-01-24 DIAGNOSIS — E669 Obesity, unspecified: Secondary | ICD-10-CM | POA: Diagnosis not present

## 2016-01-24 DIAGNOSIS — E119 Type 2 diabetes mellitus without complications: Secondary | ICD-10-CM

## 2016-01-24 LAB — MICROALBUMIN / CREATININE URINE RATIO
CREATININE, U: 159.5 mg/dL
Microalb Creat Ratio: 0.8 mg/g (ref 0.0–30.0)
Microalb, Ur: 1.2 mg/dL (ref 0.0–1.9)

## 2016-01-25 LAB — COMPREHENSIVE METABOLIC PANEL
ALT: 21 U/L (ref 0–35)
AST: 25 U/L (ref 0–37)
Albumin: 4.4 g/dL (ref 3.5–5.2)
Alkaline Phosphatase: 59 U/L (ref 39–117)
BUN: 16 mg/dL (ref 6–23)
CHLORIDE: 102 meq/L (ref 96–112)
CO2: 27 mEq/L (ref 19–32)
Calcium: 9.6 mg/dL (ref 8.4–10.5)
Creatinine, Ser: 0.77 mg/dL (ref 0.40–1.20)
GFR: 80.07 mL/min (ref >60.00)
GLUCOSE: 108 mg/dL — AB (ref 70–99)
POTASSIUM: 4.4 meq/L (ref 3.5–5.1)
SODIUM: 140 meq/L (ref 135–145)
Total Bilirubin: 0.6 mg/dL (ref 0.2–1.2)
Total Protein: 7.7 g/dL (ref 6.0–8.3)

## 2016-01-25 LAB — CBC
HEMATOCRIT: 39.9 % (ref 36.0–46.0)
HEMOGLOBIN: 13.2 g/dL (ref 12.0–15.0)
MCHC: 33.1 g/dL (ref 30.0–36.0)
MCV: 90.7 fl (ref 78.0–100.0)
PLATELETS: 253 10*3/uL (ref 150.0–400.0)
RBC: 4.4 Mil/uL (ref 3.87–5.11)
RDW: 13.5 % (ref 11.5–15.5)
WBC: 5.8 10*3/uL (ref 4.0–10.5)

## 2016-01-25 LAB — LIPID PANEL
CHOL/HDL RATIO: 4
Cholesterol: 275 mg/dL — ABNORMAL HIGH (ref 0–200)
HDL: 73 mg/dL (ref >39.00)
NonHDL: 201.91
Triglycerides: 265 mg/dL — ABNORMAL HIGH (ref 0.0–149.0)
VLDL: 53 mg/dL — AB (ref 0.0–40.0)

## 2016-01-25 LAB — TSH: TSH: 1.49 u[IU]/mL (ref 0.35–4.50)

## 2016-01-25 LAB — LDL CHOLESTEROL, DIRECT: LDL DIRECT: 175 mg/dL

## 2016-01-25 LAB — HEMOGLOBIN A1C: HEMOGLOBIN A1C: 7.4 % — AB (ref 4.6–6.5)

## 2016-03-01 ENCOUNTER — Encounter: Payer: Self-pay | Admitting: Family Medicine

## 2016-07-23 ENCOUNTER — Other Ambulatory Visit: Payer: BC Managed Care – PPO

## 2016-07-26 ENCOUNTER — Ambulatory Visit: Payer: BC Managed Care – PPO | Admitting: Family Medicine

## 2016-07-30 ENCOUNTER — Encounter: Payer: Self-pay | Admitting: Family Medicine

## 2016-07-30 ENCOUNTER — Ambulatory Visit (INDEPENDENT_AMBULATORY_CARE_PROVIDER_SITE_OTHER): Payer: Medicare Other | Admitting: Family Medicine

## 2016-07-30 DIAGNOSIS — E669 Obesity, unspecified: Secondary | ICD-10-CM

## 2016-07-30 DIAGNOSIS — E785 Hyperlipidemia, unspecified: Secondary | ICD-10-CM | POA: Diagnosis not present

## 2016-07-30 DIAGNOSIS — E1169 Type 2 diabetes mellitus with other specified complication: Secondary | ICD-10-CM

## 2016-07-30 DIAGNOSIS — T7840XA Allergy, unspecified, initial encounter: Secondary | ICD-10-CM

## 2016-07-30 DIAGNOSIS — E119 Type 2 diabetes mellitus without complications: Secondary | ICD-10-CM | POA: Diagnosis not present

## 2016-07-30 DIAGNOSIS — E038 Other specified hypothyroidism: Secondary | ICD-10-CM

## 2016-07-30 MED ORDER — LEVOTHYROXINE SODIUM 88 MCG PO TABS: 88.0000 ug | ORAL_TABLET | Freq: Every day | ORAL | 0 refills | Status: DC

## 2016-07-30 NOTE — Patient Instructions (Signed)

## 2016-07-30 NOTE — Progress Notes (Signed)
Pre visit review using our clinic review tool, if applicable. No additional management support is needed unless otherwise documented below in the visit note. 

## 2016-08-08 NOTE — Assessment & Plan Note (Signed)
Encouraged heart healthy diet, increase exercise, avoid trans fats, consider a krill oil cap daily. Tolerating atorvastatin 

## 2016-08-08 NOTE — Assessment & Plan Note (Signed)
On Levothyroxine, continue to monitor 

## 2016-08-08 NOTE — Progress Notes (Signed)
Patient ID: Jasmine Morse, female   DOB: April 28, 1951, 65 y.o.   MRN: [id number]   Subjective:    Patient ID: Jasmine Morse, female    DOB: 07-27-1951, 65 y.o.   MRN: [id number]  Chief Complaint  Patient presents with  . Follow-up    HPI Patient is in today for follow up. Is doing well. Denies recent illness or acute concerns. Patient declines a bone dexitometry and an eye exam. Denies CP/palp/SOB/HA/congestion/fevers/GI or GU c/o. Taking meds as prescribed  Past Medical History:  Diagnosis Date  . Allergic state 08/10/2014  . Cancer ([place])    colon cancer stage III (T3, N2b) s/p right hemicolectomy, FOLFOX-- Dr Learta Codding  . Diabetes mellitus    type II  . Hair loss 07/10/2015  . Obesity, unspecified 08/10/2014  . Thyroid disease    hypothyroidism    Past Surgical History:  Procedure Laterality Date  . CESAREAN SECTION     x 3  . INNER EAR SURGERY     due to numerous ear infections  . TONSILLECTOMY      Family History  Problem Relation Age of Onset  . Hypertension Son   . Cancer Father     colon  . Cancer Brother     colon  . Diabetes Other   . Hypertension Other   . Stroke Other     Social History   Social History  . Marital status: Widowed    Spouse name: N/A  . Number of children: 3  . Years of education: N/A   Occupational History  . [employer]   Social History Main Topics  . Smoking status: Never Smoker  . Smokeless tobacco: Never Used  . Alcohol use No  . Drug use: No  . Sexual activity: Not on file   Other Topics Concern  . Not on file   Social History Narrative  . No narrative on file    Outpatient Medications Prior to Visit  Medication Sig Dispense Refill  . atorvastatin (LIPITOR) 10 MG tablet Take 1 tablet (10 mg total) by mouth daily. 30 tablet 6  . glipiZIDE (GLUCOTROL) 5 MG tablet Take 1 tablet (5 mg total) by mouth daily. 90 tablet 1  . glucose blood (ACCU-CHEK AVIVA) test strip Use to test blood sugar before morning  meal.     . Lancets Misc. (ACCU-CHEK MULTICLIX LANCET DEV) KIT Use as directed     . metFORMIN (GLUCOPHAGE) 1000 MG tablet Take 1 tablet (1,000 mg total) by mouth 2 (two) times daily with a meal. 180 tablet 1  . levothyroxine (SYNTHROID, LEVOTHROID) 88 MCG tablet Take 1 tablet (88 mcg total) by mouth daily before breakfast. 90 tablet 1   No facility-administered medications prior to visit.     Allergies  Allergen Reactions  . Codeine     REACTION: Nausea \T\ Diarrhea    Review of Systems  Constitutional: Negative for fever and malaise/fatigue.  HENT: Negative for congestion.   Eyes: Negative for blurred vision.  Respiratory: Negative for shortness of breath.   Cardiovascular: Negative for chest pain, palpitations and leg swelling.  Gastrointestinal: Negative for abdominal pain, blood in stool and nausea.  Genitourinary: Negative for dysuria and frequency.  Musculoskeletal: Negative for falls.  Skin: Negative for rash.  Neurological: Negative for dizziness, loss of consciousness and headaches.  Endo/Heme/Allergies: Negative for environmental allergies.  Psychiatric/Behavioral: Negative for depression. The patient is not nervous/anxious.        Objective:  Physical Exam  Constitutional: She is oriented to person, place, and time. She appears well-developed and well-nourished. No distress.  HENT:  Head: Normocephalic and atraumatic.  Nose: Nose normal.  Eyes: Right eye exhibits no discharge. Left eye exhibits no discharge.  Neck: Normal range of motion. Neck supple.  Cardiovascular: Normal rate and regular rhythm.   No murmur heard. Pulmonary/Chest: Effort normal and breath sounds normal.  Abdominal: Soft. Bowel sounds are normal. There is no tenderness.  Musculoskeletal: She exhibits no edema.  Neurological: She is alert and oriented to person, place, and time.  Skin: Skin is warm and dry.  Psychiatric: She has a normal mood and affect.  Nursing note and vitals  reviewed.   BP 132/82 (BP Location: Left Arm, Patient Position: Sitting, Cuff Size: Large)   Pulse 64   Temp 98.2 F (36.8 C) (Oral)   Ht _0  (1.6 m)   Wt 207 lb (93.9 kg)   BMI 36.67 kg/m  Wt Readings from Last 3 Encounters:  07/30/16 207 lb (93.9 kg)  01/16/16 211 lb (95.7 kg)  06/24/15 202 lb 8 oz (91.9 kg)     Lab Results  Component Value Date   WBC 5.8 01/24/2016   HGB 13.2 01/24/2016   HCT 39.9 01/24/2016   PLT 253.0 01/24/2016   GLUCOSE 108 (H) 01/24/2016   CHOL 275 (H) 01/24/2016   TRIG 265.0 (H) 01/24/2016   HDL 73.00 01/24/2016   LDLDIRECT 175.0 01/24/2016   LDLCALC NOT CALC 06/24/2015   ALT 21 01/24/2016   AST 25 01/24/2016   NA 140 01/24/2016   K 4.4 01/24/2016   CL 102 01/24/2016   CREATININE 0.77 01/24/2016   BUN 16 01/24/2016   CO2 27 01/24/2016   TSH 1.49 01/24/2016   HGBA1C 7.4 (H) 01/24/2016   MICROALBUR 1.2 01/24/2016    Lab Results  Component Value Date   TSH 1.49 01/24/2016   Lab Results  Component Value Date   WBC 5.8 01/24/2016   HGB 13.2 01/24/2016   HCT 39.9 01/24/2016   MCV 90.7 01/24/2016   PLT 253.0 01/24/2016   Lab Results  Component Value Date   NA 140 01/24/2016   K 4.4 01/24/2016   CO2 27 01/24/2016   GLUCOSE 108 (H) 01/24/2016   BUN 16 01/24/2016   CREATININE 0.77 01/24/2016   BILITOT 0.6 01/24/2016   ALKPHOS 59 01/24/2016   AST 25 01/24/2016   ALT 21 01/24/2016   PROT 7.7 01/24/2016   ALBUMIN 4.4 01/24/2016   CALCIUM 9.6 01/24/2016   GFR 80.07 01/24/2016   Lab Results  Component Value Date   CHOL 275 (H) 01/24/2016   Lab Results  Component Value Date   HDL 73.00 01/24/2016   Lab Results  Component Value Date   LDLCALC NOT CALC 06/24/2015   Lab Results  Component Value Date   TRIG 265.0 (H) 01/24/2016   Lab Results  Component Value Date   CHOLHDL 4 01/24/2016   Lab Results  Component Value Date   HGBA1C 7.4 (H) 01/24/2016       Assessment & Plan:   Problem List Items Addressed This  Visit    Hypothyroidism    On Levothyroxine, continue to monitor      Relevant Medications   levothyroxine (SYNTHROID, LEVOTHROID) 88 MCG tablet   Diabetes mellitus type 2 in obese (HCC)    hgba1c acceptable, minimize simple carbs. Increase exercise as tolerated. Continue current meds      Allergic state   Relevant  Medications   levothyroxine (SYNTHROID, LEVOTHROID) 88 MCG tablet   Hyperlipidemia    Encouraged heart healthy diet, increase exercise, avoid trans fats, consider a krill oil cap daily. Tolerating atorvastatin      Relevant Medications   levothyroxine (SYNTHROID, LEVOTHROID) 88 MCG tablet    Other Visit Diagnoses    Obesity, unspecified       Relevant Medications   levothyroxine (SYNTHROID, LEVOTHROID) 88 MCG tablet      I am having Ms. Labree maintain her glucose blood, ACCU-CHEK MULTICLIX LANCET DEV, atorvastatin, metFORMIN, glipiZIDE, and levothyroxine.  Meds ordered this encounter  Medications  . levothyroxine (SYNTHROID, LEVOTHROID) 88 MCG tablet    Sig: Take 1 tablet (88 mcg total) by mouth daily before breakfast.    Dispense:  90 tablet    Refill:  0     Penni Homans, MD

## 2016-08-08 NOTE — Assessment & Plan Note (Signed)
hgba1c acceptable, minimize simple carbs. Increase exercise as tolerated. Continue current meds 

## 2016-11-21 ENCOUNTER — Other Ambulatory Visit: Payer: Self-pay | Admitting: *Deleted

## 2016-11-21 DIAGNOSIS — E038 Other specified hypothyroidism: Secondary | ICD-10-CM

## 2016-11-21 DIAGNOSIS — E785 Hyperlipidemia, unspecified: Secondary | ICD-10-CM

## 2016-11-21 DIAGNOSIS — E039 Hypothyroidism, unspecified: Secondary | ICD-10-CM

## 2016-11-21 DIAGNOSIS — T7840XA Allergy, unspecified, initial encounter: Secondary | ICD-10-CM

## 2016-11-21 MED ORDER — LEVOTHYROXINE SODIUM 88 MCG PO TABS: 88.0000 ug | ORAL_TABLET | Freq: Every day | ORAL | 0 refills | Status: DC

## 2016-11-21 NOTE — Progress Notes (Signed)
Rx request to pharmacy per fax request/SLS 11/22

## 2017-04-01 ENCOUNTER — Other Ambulatory Visit: Payer: Self-pay

## 2017-04-01 DIAGNOSIS — E785 Hyperlipidemia, unspecified: Secondary | ICD-10-CM

## 2017-04-01 DIAGNOSIS — E038 Other specified hypothyroidism: Secondary | ICD-10-CM

## 2017-04-01 DIAGNOSIS — E039 Hypothyroidism, unspecified: Secondary | ICD-10-CM

## 2017-04-01 DIAGNOSIS — T7840XA Allergy, unspecified, initial encounter: Secondary | ICD-10-CM

## 2017-04-01 MED ORDER — LEVOTHYROXINE SODIUM 88 MCG PO TABS: 88.0000 ug | ORAL_TABLET | Freq: Every day | ORAL | 1 refills | Status: DC

## 2017-04-09 ENCOUNTER — Encounter: Payer: Self-pay | Admitting: Family Medicine

## 2017-04-09 ENCOUNTER — Ambulatory Visit (INDEPENDENT_AMBULATORY_CARE_PROVIDER_SITE_OTHER): Payer: Medicare Other | Admitting: Family Medicine

## 2017-04-09 DIAGNOSIS — C189 Malignant neoplasm of colon, unspecified: Secondary | ICD-10-CM

## 2017-04-09 DIAGNOSIS — E669 Obesity, unspecified: Secondary | ICD-10-CM | POA: Diagnosis not present

## 2017-04-09 DIAGNOSIS — E6609 Other obesity due to excess calories: Secondary | ICD-10-CM

## 2017-04-09 DIAGNOSIS — E039 Hypothyroidism, unspecified: Secondary | ICD-10-CM | POA: Diagnosis not present

## 2017-04-09 DIAGNOSIS — E1169 Type 2 diabetes mellitus with other specified complication: Secondary | ICD-10-CM | POA: Diagnosis not present

## 2017-04-09 DIAGNOSIS — E782 Mixed hyperlipidemia: Secondary | ICD-10-CM | POA: Diagnosis not present

## 2017-04-09 NOTE — Assessment & Plan Note (Signed)
On Levothyroxine, continue to monitor 

## 2017-04-09 NOTE — Assessment & Plan Note (Signed)
Tolerating statin, encouraged heart healthy diet, avoid trans fats, minimize simple carbs and saturated fats. Increase exercise as tolerated 

## 2017-04-09 NOTE — Progress Notes (Signed)
Pre visit review using our clinic review tool, if applicable. No additional management support is needed unless otherwise documented below in the visit note. 

## 2017-04-09 NOTE — Assessment & Plan Note (Signed)
hgba1c acceptable, minimize simple carbs. Increase exercise as tolerated. Continue current meds 

## 2017-04-09 NOTE — Assessment & Plan Note (Addendum)
She is moving bowels well and has no concerns at this time but is overdue for recall colonoscopy, she agrees to referral back to GI for scope.

## 2017-04-09 NOTE — Progress Notes (Signed)
Patient ID: Jasmine Morse, female   DOB: 07-04-1951, 66 y.o.   MRN: [id number]   Subjective:    Patient ID: Jasmine Morse, female    DOB: 17-Feb-1951, 67 y.o.   MRN: [id number]  Chief Complaint  Patient presents with  . Follow-up    medication    HPI Patient is in today for follow up on DM, hyperlipdiemia, hypothyrodiism and obesity. She has not been in in over a year. She is struggling with fatigue. No recent febrile illness or recent hospitalization. No polyuria or polydipsia. Acknowledges she is not eating well or exercising. Denies CP/palp/SOB/HA/congestion/fevers/GI or GU c/o. Taking meds as prescribed  Past Medical History:  Diagnosis Date  . Allergic state 08/10/2014  . Cancer ([place])    colon cancer stage III (T3, N2b) s/p right hemicolectomy, FOLFOX-- Dr Learta Codding  . Diabetes mellitus    type II  . Hair loss 07/10/2015  . Obesity, unspecified 08/10/2014  . Thyroid disease    hypothyroidism    Past Surgical History:  Procedure Laterality Date  . CESAREAN SECTION     x 3  . INNER EAR SURGERY     due to numerous ear infections  . TONSILLECTOMY      Family History  Problem Relation Age of Onset  . Hypertension Son   . Cancer Father     colon  . Cancer Brother     colon  . Diabetes Other   . Hypertension Other   . Stroke Other     Social History   Social History  . Marital status: Widowed    Spouse name: N/A  . Number of children: 3  . Years of education: N/A   Occupational History  . [employer]   Social History Main Topics  . Smoking status: Never Smoker  . Smokeless tobacco: Never Used  . Alcohol use No  . Drug use: No  . Sexual activity: Not on file   Other Topics Concern  . Not on file   Social History Narrative  . No narrative on file    Outpatient Medications Prior to Visit  Medication Sig Dispense Refill  . atorvastatin (LIPITOR) 10 MG tablet Take 1 tablet (10 mg total) by mouth daily. 30 tablet 6  . glipiZIDE  (GLUCOTROL) 5 MG tablet Take 1 tablet (5 mg total) by mouth daily. 90 tablet 1  . glucose blood (ACCU-CHEK AVIVA) test strip Use to test blood sugar before morning meal.     . Lancets Misc. (ACCU-CHEK MULTICLIX LANCET DEV) KIT Use as directed     . levothyroxine (SYNTHROID, LEVOTHROID) 88 MCG tablet Take 1 tablet (88 mcg total) by mouth daily before breakfast. 90 tablet 1  . metFORMIN (GLUCOPHAGE) 1000 MG tablet Take 1 tablet (1,000 mg total) by mouth 2 (two) times daily with a meal. 180 tablet 1   No facility-administered medications prior to visit.     Allergies  Allergen Reactions  . Codeine     REACTION: Nausea \T\ Diarrhea    Review of Systems  Constitutional: Positive for malaise/fatigue. Negative for fever.  HENT: Negative for congestion.   Eyes: Negative for blurred vision.  Respiratory: Negative for shortness of breath.   Cardiovascular: Negative for chest pain, palpitations and leg swelling.  Gastrointestinal: Negative for abdominal pain, blood in stool and nausea.  Genitourinary: Negative for dysuria and frequency.  Musculoskeletal: Negative for falls.  Skin: Negative for rash.  Neurological: Negative for dizziness, loss of consciousness and headaches.  Endo/Heme/Allergies: Negative for environmental allergies.  Psychiatric/Behavioral: Negative for depression. The patient is not nervous/anxious.        Objective:    Physical Exam  Constitutional: She is oriented to person, place, and time. She appears well-developed and well-nourished. No distress.  HENT:  Head: Normocephalic and atraumatic.  Nose: Nose normal.  Eyes: Right eye exhibits no discharge. Left eye exhibits no discharge.  Neck: Normal range of motion. Neck supple.  Cardiovascular: Normal rate and regular rhythm.   No murmur heard. Pulmonary/Chest: Effort normal and breath sounds normal.  Abdominal: Soft. Bowel sounds are normal. There is no tenderness.  Musculoskeletal: She exhibits no edema.    Neurological: She is alert and oriented to person, place, and time.  Skin: Skin is warm and dry.  Psychiatric: She has a normal mood and affect.  Nursing note and vitals reviewed.   BP 122/70 (BP Location: Left Arm, Patient Position: Sitting, Cuff Size: Large)   Pulse 78   Temp 98.2 F (36.8 C) (Oral)   Ht 5' 3"  (1.6 m)   Wt 215 lb (97.5 kg)   SpO2 94%   BMI 38.09 kg/m  Wt Readings from Last 3 Encounters:  04/09/17 215 lb (97.5 kg)  07/30/16 207 lb (93.9 kg)  01/16/16 211 lb (95.7 kg)     Lab Results  Component Value Date   WBC 5.8 01/24/2016   HGB 13.2 01/24/2016   HCT 39.9 01/24/2016   PLT 253.0 01/24/2016   GLUCOSE 108 (H) 01/24/2016   CHOL 275 (H) 01/24/2016   TRIG 265.0 (H) 01/24/2016   HDL 73.00 01/24/2016   LDLDIRECT 175.0 01/24/2016   LDLCALC NOT CALC 06/24/2015   ALT 21 01/24/2016   AST 25 01/24/2016   NA 140 01/24/2016   K 4.4 01/24/2016   CL 102 01/24/2016   CREATININE 0.77 01/24/2016   BUN 16 01/24/2016   CO2 27 01/24/2016   TSH 1.49 01/24/2016   HGBA1C 7.4 (H) 01/24/2016   MICROALBUR 1.2 01/24/2016    Lab Results  Component Value Date   TSH 1.49 01/24/2016   Lab Results  Component Value Date   WBC 5.8 01/24/2016   HGB 13.2 01/24/2016   HCT 39.9 01/24/2016   MCV 90.7 01/24/2016   PLT 253.0 01/24/2016   Lab Results  Component Value Date   NA 140 01/24/2016   K 4.4 01/24/2016   CO2 27 01/24/2016   GLUCOSE 108 (H) 01/24/2016   BUN 16 01/24/2016   CREATININE 0.77 01/24/2016   BILITOT 0.6 01/24/2016   ALKPHOS 59 01/24/2016   AST 25 01/24/2016   ALT 21 01/24/2016   PROT 7.7 01/24/2016   ALBUMIN 4.4 01/24/2016   CALCIUM 9.6 01/24/2016   GFR 80.07 01/24/2016   Lab Results  Component Value Date   CHOL 275 (H) 01/24/2016   Lab Results  Component Value Date   HDL 73.00 01/24/2016   Lab Results  Component Value Date   LDLCALC NOT CALC 06/24/2015   Lab Results  Component Value Date   TRIG 265.0 (H) 01/24/2016   Lab Results   Component Value Date   CHOLHDL 4 01/24/2016   Lab Results  Component Value Date   HGBA1C 7.4 (H) 01/24/2016       Assessment & Plan:   Problem List Items Addressed This Visit    Malignant neoplasm of colon ([place])    She is moving bowels well and has no concerns at this time but is overdue for recall colonoscopy, she agrees to  referral back to GI for scope.       Relevant Orders   Ambulatory referral to Gastroenterology   Hypothyroidism    On Levothyroxine, continue to monitor      Relevant Orders   TSH   Diabetes mellitus type 2 in obese ([facility])    hgba1c acceptable, minimize simple carbs. Increase exercise as tolerated. Continue current meds      Relevant Orders   CBC   Hemoglobin A1c   Comprehensive metabolic panel   Microalbumin / creatinine urine ratio   Obesity    Encouraged DASH diet, decrease po intake and increase exercise as tolerated. Needs 7-8 hours of sleep nightly. Avoid trans fats, eat small, frequent meals every 4-5 hours with lean proteins, complex carbs and healthy fats. Minimize simple carbs, referred to bariatric program      Relevant Orders   CBC   TSH   Hyperlipidemia    Tolerating statin, encouraged heart healthy diet, avoid trans fats, minimize simple carbs and saturated fats. Increase exercise as tolerated.       Relevant Orders   Lipid panel      I am having Ms. Eichler maintain her glucose blood, ACCU-CHEK MULTICLIX LANCET DEV, atorvastatin, metFORMIN, glipiZIDE, and levothyroxine.  No orders of the defined types were placed in this encounter.    Penni Homans, MD

## 2017-04-09 NOTE — Patient Instructions (Signed)
6-8 hours of sleep nightly   Carbohydrate Counting for Diabetes Mellitus, Adult Carbohydrate counting is a method for keeping track of how many carbohydrates you eat. Eating carbohydrates naturally increases the amount of sugar (glucose) in the blood. Counting how many carbohydrates you eat helps keep your blood glucose within normal limits, which helps you manage your diabetes (diabetes mellitus). It is important to know how many carbohydrates you can safely have in each meal. This is different for every person. A diet and nutrition specialist (registered dietitian) can help you make a meal plan and calculate how many carbohydrates you should have at each meal and snack. Carbohydrates are found in the following foods:  Grains, such as breads and cereals.  Dried beans and soy products.  Starchy vegetables, such as potatoes, peas, and corn.  Fruit and fruit juices.  Milk and yogurt.  Sweets and snack foods, such as cake, cookies, candy, chips, and soft drinks. How do I count carbohydrates? There are two ways to count carbohydrates in food. You can use either of the methods or a combination of both. Reading "Nutrition Facts" on packaged food  The "Nutrition Facts" list is included on the labels of almost all packaged foods and beverages in the [place] It includes:  The serving size.  Information about nutrients in each serving, including the grams (g) of carbohydrate per serving. To use the "Nutrition Facts":  Decide how many servings you will have.  Multiply the number of servings by the number of carbohydrates per serving.  The resulting number is the total amount of carbohydrates that you will be having. Learning standard serving sizes of other foods  When you eat foods containing carbohydrates that are not packaged or do not include "Nutrition Facts" on the label, you need to measure the servings in order to count the amount of carbohydrates:  Measure the foods that you will eat  with a food scale or measuring cup, if needed.  Decide how many standard-size servings you will eat.  Multiply the number of servings by 15. Most carbohydrate-rich foods have about 15 g of carbohydrates per serving.  For example, if you eat 8 oz (170 g) of strawberries, you will have eaten 2 servings and 30 g of carbohydrates (2 servings x 15 g = 30 g).  For foods that have more than one food mixed, such as soups and casseroles, you must count the carbohydrates in each food that is included. The following list contains standard serving sizes of common carbohydrate-rich foods. Each of these servings has about 15 g of carbohydrates:   hamburger bun or  English muffin.   oz (15 mL) syrup.   oz (14 g) jelly.  1 slice of bread.  1 six-inch tortilla.  3 oz (85 g) cooked rice or pasta.  4 oz (113 g) cooked dried beans.  4 oz (113 g) starchy vegetable, such as peas, corn, or potatoes.  4 oz (113 g) hot cereal.  4 oz (113 g) mashed potatoes or  of a large baked potato.  4 oz (113 g) canned or frozen fruit.  4 oz (120 mL) fruit juice.  4-6 crackers.  6 chicken nuggets.  6 oz (170 g) unsweetened dry cereal.  6 oz (170 g) plain fat-free yogurt or yogurt sweetened with artificial sweeteners.  8 oz (240 mL) milk.  8 oz (170 g) fresh fruit or one small piece of fruit.  24 oz (680 g) popped popcorn. Example of carbohydrate counting Sample meal   3  oz (85 g) chicken breast.  6 oz (170 g) brown rice.  4 oz (113 g) corn.  8 oz (240 mL) milk.  8 oz (170 g) strawberries with sugar-free whipped topping. Carbohydrate calculation  1. Identify the foods that contain carbohydrates:  Rice.  Corn.  Milk.  Strawberries. 2. Calculate how many servings you have of each food:  2 servings rice.  1 serving corn.  1 serving milk.  1 serving strawberries. 3. Multiply each number of servings by 15 g:  2 servings rice x 15 g = 30 g.  1 serving corn x 15 g = 15  g.  1 serving milk x 15 g = 15 g.  1 serving strawberries x 15 g = 15 g. 4. Add together all of the amounts to find the total grams of carbohydrates eaten:  30 g + 15 g + 15 g + 15 g = 75 g of carbohydrates total. This information is not intended to replace advice given to you by your health care provider. Make sure you discuss any questions you have with your health care provider. Document Released: 12/17/2005 Document Revised: 07/06/2016 Document Reviewed: 05/30/2016 Elsevier Interactive Patient Education  2017 [employer].

## 2017-04-09 NOTE — Assessment & Plan Note (Signed)
Encouraged DASH diet, decrease po intake and increase exercise as tolerated. Needs 7-8 hours of sleep nightly. Avoid trans fats, eat small, frequent meals every 4-5 hours with lean proteins, complex carbs and healthy fats. Minimize simple carbs, referred to bariatric program 

## 2017-05-06 ENCOUNTER — Other Ambulatory Visit: Payer: Self-pay | Admitting: Family Medicine

## 2017-05-06 ENCOUNTER — Other Ambulatory Visit (INDEPENDENT_AMBULATORY_CARE_PROVIDER_SITE_OTHER): Payer: Medicare Other

## 2017-05-06 DIAGNOSIS — E669 Obesity, unspecified: Secondary | ICD-10-CM | POA: Diagnosis not present

## 2017-05-06 DIAGNOSIS — E039 Hypothyroidism, unspecified: Secondary | ICD-10-CM | POA: Diagnosis not present

## 2017-05-06 DIAGNOSIS — E6609 Other obesity due to excess calories: Secondary | ICD-10-CM | POA: Diagnosis not present

## 2017-05-06 DIAGNOSIS — E782 Mixed hyperlipidemia: Secondary | ICD-10-CM

## 2017-05-06 DIAGNOSIS — E1169 Type 2 diabetes mellitus with other specified complication: Secondary | ICD-10-CM

## 2017-05-06 LAB — LIPID PANEL
Cholesterol: 241 mg/dL — ABNORMAL HIGH (ref 0–200)
HDL: 63.8 mg/dL (ref >39.00)
NONHDL: 177.16
TRIGLYCERIDES: 264 mg/dL — AB (ref 0.0–149.0)
Total CHOL/HDL Ratio: 4
VLDL: 52.8 mg/dL — ABNORMAL HIGH (ref 0.0–40.0)

## 2017-05-06 LAB — CBC
HEMATOCRIT: 41 % (ref 36.0–46.0)
Hemoglobin: 13.6 g/dL (ref 12.0–15.0)
MCHC: 33.2 g/dL (ref 30.0–36.0)
MCV: 92.4 fl (ref 78.0–100.0)
Platelets: 222 10*3/uL (ref 150.0–400.0)
RBC: 4.44 Mil/uL (ref 3.87–5.11)
RDW: 13.4 % (ref 11.5–15.5)
WBC: 5.1 10*3/uL (ref 4.0–10.5)

## 2017-05-06 LAB — LDL CHOLESTEROL, DIRECT: Direct LDL: 144 mg/dL

## 2017-05-06 LAB — COMPREHENSIVE METABOLIC PANEL
ALT: 56 U/L — AB (ref 0–35)
AST: 43 U/L — ABNORMAL HIGH (ref 0–37)
Albumin: 4.2 g/dL (ref 3.5–5.2)
Alkaline Phosphatase: 65 U/L (ref 39–117)
BILIRUBIN TOTAL: 0.5 mg/dL (ref 0.2–1.2)
BUN: 14 mg/dL (ref 6–23)
CALCIUM: 9.4 mg/dL (ref 8.4–10.5)
CHLORIDE: 101 meq/L (ref 96–112)
CO2: 25 meq/L (ref 19–32)
Creatinine, Ser: 0.75 mg/dL (ref 0.40–1.20)
GFR: 82.22 mL/min (ref >60.00)
Glucose, Bld: 215 mg/dL — ABNORMAL HIGH (ref 70–99)
POTASSIUM: 3.9 meq/L (ref 3.5–5.1)
Sodium: 138 mEq/L (ref 135–145)
Total Protein: 7.5 g/dL (ref 6.0–8.3)

## 2017-05-06 LAB — HEMOGLOBIN A1C: Hgb A1c MFr Bld: 10.3 % — ABNORMAL HIGH (ref 4.6–6.5)

## 2017-05-06 LAB — TSH: TSH: 1.88 u[IU]/mL (ref 0.35–4.50)

## 2017-05-06 LAB — MICROALBUMIN / CREATININE URINE RATIO
CREATININE, URINE: 45 mg/dL (ref 20–320)
MICROALB UR: 0.3 mg/dL
MICROALB/CREAT RATIO: 7 ug/mg{creat} (ref <30)

## 2017-09-09 ENCOUNTER — Encounter: Payer: Self-pay | Admitting: Family Medicine

## 2017-09-09 ENCOUNTER — Ambulatory Visit (INDEPENDENT_AMBULATORY_CARE_PROVIDER_SITE_OTHER): Payer: Medicare Other | Admitting: Family Medicine

## 2017-09-09 VITALS — BP 118/68 | HR 67 | Temp 98.0°F | Resp 18 | Ht 63.0 in | Wt 209.0 lb

## 2017-09-09 DIAGNOSIS — E782 Mixed hyperlipidemia: Secondary | ICD-10-CM | POA: Diagnosis not present

## 2017-09-09 DIAGNOSIS — E1169 Type 2 diabetes mellitus with other specified complication: Secondary | ICD-10-CM

## 2017-09-09 DIAGNOSIS — E669 Obesity, unspecified: Secondary | ICD-10-CM

## 2017-09-09 DIAGNOSIS — Z0000 Encounter for general adult medical examination without abnormal findings: Secondary | ICD-10-CM | POA: Insufficient documentation

## 2017-09-09 DIAGNOSIS — E6609 Other obesity due to excess calories: Secondary | ICD-10-CM | POA: Diagnosis not present

## 2017-09-09 DIAGNOSIS — C189 Malignant neoplasm of colon, unspecified: Secondary | ICD-10-CM

## 2017-09-09 DIAGNOSIS — E039 Hypothyroidism, unspecified: Secondary | ICD-10-CM

## 2017-09-09 HISTORY — DX: Encounter for general adult medical examination without abnormal findings: Z00.00

## 2017-09-09 NOTE — Assessment & Plan Note (Signed)
Encouraged DASH diet, decrease po intake and increase exercise as tolerated. Needs 7-8 hours of sleep nightly. Avoid trans fats, eat small, frequent meals every 4-5 hours with lean proteins, complex carbs and healthy fats. Minimize simple carbs, GMO foods. 

## 2017-09-09 NOTE — Assessment & Plan Note (Signed)
Tolerating statin, encouraged heart healthy diet, avoid trans fats, minimize simple carbs and saturated fats. Increase exercise as tolerated 

## 2017-09-09 NOTE — Assessment & Plan Note (Signed)
hgba1c unacceptable, minimize simple carbs. Increase exercise as tolerated. Continue current meds 

## 2017-09-09 NOTE — Progress Notes (Signed)
Subjective:  I acted as a Education administrator for Dr. Charlett Blake. [place], [place]   Patient ID: Jasmine Morse, female    DOB: Jul 16, 1951, 66 y.o.   MRN: [id number]  No chief complaint on file.   HPI  Patient is in today for a follow up and annual preventative exam. No recent febrile illness or hospitalization. She is staying active and eating a heart healthy diet. Is doing well with activities of daily living. She denies polyuria or polydipsia. Denies CP/palp/SOB/HA/congestion/fevers/GI or GU c/o. Taking meds as prescribed  Patient Care Team: Mosie Lukes, MD as PCP - General (Family Medicine)   Past Medical History:  Diagnosis Date  . Allergic state 08/10/2014  . Cancer ([place])    colon cancer stage III (T3, N2b) s/p right hemicolectomy, FOLFOX-- Dr Learta Codding  . Diabetes mellitus    type II  . Hair loss 07/10/2015  . Obesity, unspecified 08/10/2014  . Preventative health care 09/09/2017  . Thyroid disease    hypothyroidism    Past Surgical History:  Procedure Laterality Date  . CESAREAN SECTION     x 3  . INNER EAR SURGERY     due to numerous ear infections  . TONSILLECTOMY      Family History  Problem Relation Age of Onset  . Hypertension Son   . Cancer Father        colon  . Cancer Brother        colon  . Diabetes Other   . Hypertension Other   . Stroke Other     Social History   Social History  . Marital status: Widowed    Spouse name: N/A  . Number of children: 3  . Years of education: N/A   Occupational History  . [employer]   Social History Main Topics  . Smoking status: Never Smoker  . Smokeless tobacco: Never Used  . Alcohol use No  . Drug use: No  . Sexual activity: Not on file   Other Topics Concern  . Not on file   Social History Narrative  . No narrative on file    Outpatient Medications Prior to Visit  Medication Sig Dispense Refill  . glipiZIDE (GLUCOTROL) 5 MG tablet Take 1 tablet (5 mg total) by mouth daily. (Patient taking  differently: Take 5 mg by mouth 2 (two) times daily before a meal. ) 90 tablet 1  . glucose blood (ACCU-CHEK AVIVA) test strip Use to test blood sugar before morning meal.     . Lancets Misc. (ACCU-CHEK MULTICLIX LANCET DEV) KIT Use as directed     . levothyroxine (SYNTHROID, LEVOTHROID) 88 MCG tablet Take 1 tablet (88 mcg total) by mouth daily before breakfast. 90 tablet 1  . metFORMIN (GLUCOPHAGE) 1000 MG tablet Take 1 tablet (1,000 mg total) by mouth 2 (two) times daily with a meal. 180 tablet 1  . atorvastatin (LIPITOR) 10 MG tablet Take 1 tablet (10 mg total) by mouth daily. 30 tablet 6   No facility-administered medications prior to visit.     Allergies  Allergen Reactions  . Codeine     REACTION: Nausea \T\ Diarrhea    Review of Systems  Constitutional: Negative for fever and malaise/fatigue.  HENT: Negative for congestion.   Eyes: Negative for blurred vision.  Respiratory: Negative for cough and shortness of breath.   Cardiovascular: Negative for chest pain, palpitations and leg swelling.  Gastrointestinal: Negative for vomiting.  Musculoskeletal: Negative for back pain.  Skin: Negative for  rash.  Neurological: Negative for loss of consciousness and headaches.       Objective:    Physical Exam  Constitutional: She is oriented to person, place, and time. She appears well-developed and well-nourished. No distress.  HENT:  Head: Normocephalic and atraumatic.  Eyes: Conjunctivae are normal.  Neck: Normal range of motion. No thyromegaly present.  Cardiovascular: Normal rate and regular rhythm.   Pulmonary/Chest: Effort normal and breath sounds normal. She has no wheezes.  Abdominal: Soft. Bowel sounds are normal. There is no tenderness.  Musculoskeletal: Normal range of motion. She exhibits no edema or deformity.  Neurological: She is alert and oriented to person, place, and time.  Skin: Skin is warm and dry. She is not diaphoretic.  Psychiatric: She has a normal mood  and affect.    BP 118/68 (BP Location: Left Arm, Patient Position: Sitting, Cuff Size: Normal)   Pulse 67   Temp 98 F (36.7 C) (Oral)   Resp 18   Ht 5' 3"  (1.6 m)   Wt 209 lb (94.8 kg)   SpO2 96%   BMI 37.02 kg/m  Wt Readings from Last 3 Encounters:  09/09/17 209 lb (94.8 kg)  04/09/17 215 lb (97.5 kg)  07/30/16 207 lb (93.9 kg)   BP Readings from Last 3 Encounters:  09/09/17 118/68  04/09/17 122/70  07/30/16 132/82     Immunization History  Administered Date(s) Administered  . Influenza Split 01/13/2013  . Influenza Whole 11/01/2009  . Pneumococcal Polysaccharide-23 11/01/2009    Health Maintenance  Topic Date Due  . OPHTHALMOLOGY EXAM  07/07/1961  . DEXA SCAN  07/07/2016  . PNA vac Low Risk Adult (1 of 2 - PCV13) 07/07/2016  . FOOT EXAM  01/15/2017  . INFLUENZA VACCINE  07/31/2017  . MAMMOGRAM  01/15/2018  . HEMOGLOBIN A1C  03/09/2018  . URINE MICROALBUMIN  05/06/2018  . COLONOSCOPY  04/02/2022  . TETANUS/TDAP  01/15/2026  . Hepatitis C Screening  Addressed    Lab Results  Component Value Date   WBC 5.1 05/06/2017   HGB 13.6 05/06/2017   HCT 41.0 05/06/2017   PLT 222.0 05/06/2017   GLUCOSE 361 (H) 09/09/2017   CHOL 255 (H) 09/09/2017   TRIG 376.0 (H) 09/09/2017   HDL 48.90 09/09/2017   LDLDIRECT 156.0 09/09/2017   LDLCALC NOT CALC 06/24/2015   ALT 83 (H) 09/09/2017   AST 100 (H) 09/09/2017   NA 136 09/09/2017   K 4.4 09/09/2017   CL 96 09/09/2017   CREATININE 1.10 09/09/2017   BUN 12 09/09/2017   CO2 33 (H) 09/09/2017   TSH 1.88 05/06/2017   HGBA1C 11.8 (H) 09/09/2017   MICROALBUR 0.3 05/06/2017    Lab Results  Component Value Date   TSH 1.88 05/06/2017   Lab Results  Component Value Date   WBC 5.1 05/06/2017   HGB 13.6 05/06/2017   HCT 41.0 05/06/2017   MCV 92.4 05/06/2017   PLT 222.0 05/06/2017   Lab Results  Component Value Date   NA 136 09/09/2017   K 4.4 09/09/2017   CO2 33 (H) 09/09/2017   GLUCOSE 361 (H) 09/09/2017    BUN 12 09/09/2017   CREATININE 1.10 09/09/2017   BILITOT 0.5 09/09/2017   ALKPHOS 67 09/09/2017   AST 100 (H) 09/09/2017   ALT 83 (H) 09/09/2017   PROT 7.2 09/09/2017   ALBUMIN 4.1 09/09/2017   CALCIUM 9.8 09/09/2017   GFR 52.79 (L) 09/09/2017   Lab Results  Component Value Date  CHOL 255 (H) 09/09/2017   Lab Results  Component Value Date   HDL 48.90 09/09/2017   Lab Results  Component Value Date   LDLCALC NOT CALC 06/24/2015   Lab Results  Component Value Date   TRIG 376.0 (H) 09/09/2017   Lab Results  Component Value Date   CHOLHDL 5 09/09/2017   Lab Results  Component Value Date   HGBA1C 11.8 (H) 09/09/2017         Assessment & Plan:   Problem List Items Addressed This Visit    Malignant neoplasm of colon ([place])   Relevant Orders   Ambulatory referral to Gastroenterology   Hypothyroidism    On Levothyroxine, continue to monitor      Diabetes mellitus type 2 in obese ([place])    hgba1c unacceptable, minimize simple carbs. Increase exercise as tolerated. Continue current meds      Relevant Orders   Hemoglobin A1c (Completed)   Ambulatory referral to Endocrinology   Obesity    Encouraged DASH diet, decrease po intake and increase exercise as tolerated. Needs 7-8 hours of sleep nightly. Avoid trans fats, eat small, frequent meals every 4-5 hours with lean proteins, complex carbs and healthy fats. Minimize simple carbs, GMO foods.      Relevant Orders   Comprehensive metabolic panel (Completed)   Hyperlipidemia    Tolerating statin, encouraged heart healthy diet, avoid trans fats, minimize simple carbs and saturated fats. Increase exercise as tolerated      Relevant Orders   Lipid panel (Completed)   Preventative health care    Patient encouraged to maintain heart healthy diet, regular exercise, adequate sleep. Consider daily probiotics. Take medications as prescribed. Labs today         I am having Ms. Ragon maintain her glucose blood, ACCU-CHEK  MULTICLIX LANCET DEV, metFORMIN, glipiZIDE, and levothyroxine.  No orders of the defined types were placed in this encounter.   CMA served as Education administrator during this visit. History, Physical and Plan performed by medical provider. Documentation and orders reviewed and attested to.  Penni Homans, MD

## 2017-09-09 NOTE — Assessment & Plan Note (Signed)
Patient encouraged to maintain heart healthy diet, regular exercise, adequate sleep. Consider daily probiotics. Take medications as prescribed. Labs today 

## 2017-09-09 NOTE — Patient Instructions (Addendum)
New shingles shot is called Shingrix is a 2 shot series. Prevnar is the new pneumonia shot  Preventive Care 66 Years and Older, Female Preventive care refers to lifestyle choices and visits with your health care provider that can promote health and wellness. What does preventive care include?  A yearly physical exam. This is also called an annual well check.  Dental exams once or twice a year.  Routine eye exams. Ask your health care provider how often you should have your eyes checked.  Personal lifestyle choices, including: ? Daily care of your teeth and gums. ? Regular physical activity. ? Eating a healthy diet. ? Avoiding tobacco and drug use. ? Limiting alcohol use. ? Practicing safe sex. ? Taking low-dose aspirin every day. ? Taking vitamin and mineral supplements as recommended by your health care provider. What happens during an annual well check? The services and screenings done by your health care provider during your annual well check will depend on your age, overall health, lifestyle risk factors, and family history of disease. Counseling Your health care provider may ask you questions about your:  Alcohol use.  Tobacco use.  Drug use.  Emotional well-being.  Home and relationship well-being.  Sexual activity.  Eating habits.  History of falls.  Memory and ability to understand (cognition).  Work and work Statistician.  Reproductive health.  Screening You may have the following tests or measurements:  Height, weight, and BMI.  Blood pressure.  Lipid and cholesterol levels. These may be checked every 5 years, or more frequently if you are over 26 years old.  Skin check.  Lung cancer screening. You may have this screening every year starting at age 36 if you have a 30-pack-year history of smoking and currently smoke or have quit within the past 15 years.  Fecal occult blood test (FOBT) of the stool. You may have this test every year starting at  age 8.  Flexible sigmoidoscopy or colonoscopy. You may have a sigmoidoscopy every 5 years or a colonoscopy every 10 years starting at age 50.  Hepatitis C blood test.  Hepatitis B blood test.  Sexually transmitted disease (STD) testing.  Diabetes screening. This is done by checking your blood sugar (glucose) after you have not eaten for a while (fasting). You may have this done every 1-3 years.  Bone density scan. This is done to screen for osteoporosis. You may have this done starting at age 34.  Mammogram. This may be done every 1-2 years. Talk to your health care provider about how often you should have regular mammograms.  Talk with your health care provider about your test results, treatment options, and if necessary, the need for more tests. Vaccines Your health care provider may recommend certain vaccines, such as:  Influenza vaccine. This is recommended every year.  Tetanus, diphtheria, and acellular pertussis (Tdap, Td) vaccine. You may need a Td booster every 10 years.  Varicella vaccine. You may need this if you have not been vaccinated.  Zoster vaccine. You may need this after age 19.  Measles, mumps, and rubella (MMR) vaccine. You may need at least one dose of MMR if you were born in 1957 or later. You may also need a second dose.  Pneumococcal 13-valent conjugate (PCV13) vaccine. One dose is recommended after age 60.  Pneumococcal polysaccharide (PPSV23) vaccine. One dose is recommended after age 58.  Meningococcal vaccine. You may need this if you have certain conditions.  Hepatitis A vaccine. You may need this if you  have certain conditions or if you travel or work in places where you may be exposed to hepatitis A.  Hepatitis B vaccine. You may need this if you have certain conditions or if you travel or work in places where you may be exposed to hepatitis B.  Haemophilus influenzae type b (Hib) vaccine. You may need this if you have certain  conditions.  Talk to your health care provider about which screenings and vaccines you need and how often you need them. This information is not intended to replace advice given to you by your health care provider. Make sure you discuss any questions you have with your health care provider. Document Released: 01/13/2016 Document Revised: 09/05/2016 Document Reviewed: 10/18/2015 Elsevier Interactive Patient Education  2017 [employer].

## 2017-09-09 NOTE — Assessment & Plan Note (Signed)
On Levothyroxine, continue to monitor 

## 2017-09-10 ENCOUNTER — Other Ambulatory Visit: Payer: Self-pay

## 2017-09-10 LAB — COMPREHENSIVE METABOLIC PANEL
ALK PHOS: 67 U/L (ref 39–117)
ALT: 83 U/L — ABNORMAL HIGH (ref 0–35)
AST: 100 U/L — AB (ref 0–37)
Albumin: 4.1 g/dL (ref 3.5–5.2)
BUN: 12 mg/dL (ref 6–23)
CO2: 33 mEq/L — ABNORMAL HIGH (ref 19–32)
Calcium: 9.8 mg/dL (ref 8.4–10.5)
Chloride: 96 mEq/L (ref 96–112)
Creatinine, Ser: 1.1 mg/dL (ref 0.40–1.20)
GFR: 52.79 mL/min — ABNORMAL LOW (ref >60.00)
GLUCOSE: 361 mg/dL — AB (ref 70–99)
POTASSIUM: 4.4 meq/L (ref 3.5–5.1)
SODIUM: 136 meq/L (ref 135–145)
TOTAL PROTEIN: 7.2 g/dL (ref 6.0–8.3)
Total Bilirubin: 0.5 mg/dL (ref 0.2–1.2)

## 2017-09-10 LAB — LIPID PANEL
CHOL/HDL RATIO: 5
Cholesterol: 255 mg/dL — ABNORMAL HIGH (ref 0–200)
HDL: 48.9 mg/dL (ref >39.00)
NonHDL: 206.17
Triglycerides: 376 mg/dL — ABNORMAL HIGH (ref 0.0–149.0)
VLDL: 75.2 mg/dL — AB (ref 0.0–40.0)

## 2017-09-10 LAB — LDL CHOLESTEROL, DIRECT: LDL DIRECT: 156 mg/dL

## 2017-09-10 LAB — HEMOGLOBIN A1C: Hgb A1c MFr Bld: 11.8 % — ABNORMAL HIGH (ref 4.6–6.5)

## 2017-09-10 MED ORDER — ATORVASTATIN CALCIUM 20 MG PO TABS: 20.0000 mg | ORAL_TABLET | Freq: Every day | ORAL | 3 refills | Status: DC

## 2017-10-07 ENCOUNTER — Encounter: Payer: Medicare Other | Admitting: Family Medicine

## 2017-12-03 ENCOUNTER — Other Ambulatory Visit: Payer: Self-pay | Admitting: Family Medicine

## 2017-12-03 DIAGNOSIS — E038 Other specified hypothyroidism: Secondary | ICD-10-CM

## 2017-12-03 DIAGNOSIS — E039 Hypothyroidism, unspecified: Secondary | ICD-10-CM

## 2017-12-03 DIAGNOSIS — T7840XA Allergy, unspecified, initial encounter: Secondary | ICD-10-CM

## 2017-12-03 DIAGNOSIS — E785 Hyperlipidemia, unspecified: Secondary | ICD-10-CM

## 2018-01-06 ENCOUNTER — Ambulatory Visit: Payer: Medicare Other | Admitting: Family Medicine

## 2018-02-06 ENCOUNTER — Ambulatory Visit: Payer: Medicare Other | Admitting: Family Medicine

## 2018-02-06 ENCOUNTER — Encounter: Payer: Self-pay | Admitting: Family Medicine

## 2018-02-06 ENCOUNTER — Telehealth: Payer: Self-pay | Admitting: Family Medicine

## 2018-02-06 DIAGNOSIS — E6609 Other obesity due to excess calories: Secondary | ICD-10-CM

## 2018-02-06 DIAGNOSIS — E782 Mixed hyperlipidemia: Secondary | ICD-10-CM | POA: Diagnosis not present

## 2018-02-06 DIAGNOSIS — E1169 Type 2 diabetes mellitus with other specified complication: Secondary | ICD-10-CM

## 2018-02-06 DIAGNOSIS — E669 Obesity, unspecified: Secondary | ICD-10-CM

## 2018-02-06 DIAGNOSIS — E039 Hypothyroidism, unspecified: Secondary | ICD-10-CM | POA: Diagnosis not present

## 2018-02-06 LAB — MICROALBUMIN / CREATININE URINE RATIO
Creatinine,U: 48.8 mg/dL
Microalb Creat Ratio: 1.4 mg/g (ref 0.0–30.0)

## 2018-02-06 LAB — HEMOGLOBIN A1C: Hgb A1c MFr Bld: 12.4 % — ABNORMAL HIGH (ref 4.6–6.5)

## 2018-02-06 LAB — LIPID PANEL
CHOL/HDL RATIO: 4
Cholesterol: 245 mg/dL — ABNORMAL HIGH (ref 0–200)
HDL: 55.8 mg/dL (ref >39.00)
NONHDL: 188.71
Triglycerides: 266 mg/dL — ABNORMAL HIGH (ref 0.0–149.0)
VLDL: 53.2 mg/dL — AB (ref 0.0–40.0)

## 2018-02-06 LAB — COMPREHENSIVE METABOLIC PANEL
ALT: 47 U/L — ABNORMAL HIGH (ref 0–35)
AST: 49 U/L — ABNORMAL HIGH (ref 0–37)
Albumin: 3.9 g/dL (ref 3.5–5.2)
Alkaline Phosphatase: 70 U/L (ref 39–117)
BUN: 12 mg/dL (ref 6–23)
CHLORIDE: 95 meq/L — AB (ref 96–112)
CO2: 33 meq/L — AB (ref 19–32)
Calcium: 9.2 mg/dL (ref 8.4–10.5)
Creatinine, Ser: 0.76 mg/dL (ref 0.40–1.20)
GFR: 80.78 mL/min (ref >60.00)
GLUCOSE: 362 mg/dL — AB (ref 70–99)
POTASSIUM: 4.2 meq/L (ref 3.5–5.1)
SODIUM: 134 meq/L — AB (ref 135–145)
Total Bilirubin: 0.6 mg/dL (ref 0.2–1.2)
Total Protein: 7.4 g/dL (ref 6.0–8.3)

## 2018-02-06 LAB — LDL CHOLESTEROL, DIRECT: Direct LDL: 162 mg/dL

## 2018-02-06 MED ORDER — LISINOPRIL 5 MG PO TABS: 5.0000 mg | ORAL_TABLET | Freq: Every day | ORAL | 3 refills | Status: DC

## 2018-02-06 NOTE — Assessment & Plan Note (Addendum)
hgba1c unacceptable, minimize simple carbs. Increase exercise as tolerated. Continue current meds for now while we recheck hgba1c

## 2018-02-06 NOTE — Patient Instructions (Signed)

## 2018-02-06 NOTE — Assessment & Plan Note (Signed)
On Levothyroxine, continue to monitor 

## 2018-02-06 NOTE — Assessment & Plan Note (Signed)
Encouraged DASH diet, decrease po intake and increase exercise as tolerated. Needs 7-8 hours of sleep nightly. Avoid trans fats, eat small, frequent meals every 4-5 hours with lean proteins, complex carbs and healthy fats. Minimize simple carbs, 

## 2018-02-06 NOTE — Telephone Encounter (Signed)
Copied from [place] [id number]. Topic: Quick Communication - See Telephone Encounter >> Feb 06, 2018 10:57 AM Hewitt Shorts wrote: CRM for notification. See Telephone encounter for: pt pharmacy [facility] called stating that the accu check guide meter was given to patient but there is no rx for lancets or test strips for this accu check guide meter and also she has not taken metformin since 2017 and if she is supposed to be back on it the pharmacy needs and rx for that as well  Best number for [facility] is [phone]  02/06/18.
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
## 2018-02-06 NOTE — Assessment & Plan Note (Signed)
Tolerating statin, encouraged heart healthy diet, avoid trans fats, minimize simple carbs and saturated fats. Increase exercise as tolerated 

## 2018-02-07 ENCOUNTER — Other Ambulatory Visit: Payer: Self-pay

## 2018-02-07 DIAGNOSIS — E038 Other specified hypothyroidism: Secondary | ICD-10-CM

## 2018-02-07 DIAGNOSIS — T7840XA Allergy, unspecified, initial encounter: Secondary | ICD-10-CM

## 2018-02-07 DIAGNOSIS — E785 Hyperlipidemia, unspecified: Secondary | ICD-10-CM

## 2018-02-07 DIAGNOSIS — E669 Obesity, unspecified: Secondary | ICD-10-CM

## 2018-02-07 MED ORDER — GLIPIZIDE 5 MG PO TABS: 5.0000 mg | ORAL_TABLET | Freq: Every day | ORAL | 1 refills | Status: DC

## 2018-02-07 MED ORDER — EZ SMART BLOOD GLUCOSE LANCETS MISC: 1.0000 | Freq: Every day | 12 refills | Status: DC

## 2018-02-07 MED ORDER — METFORMIN HCL 1000 MG PO TABS: 1000.0000 mg | ORAL_TABLET | Freq: Two times a day (BID) | ORAL | 1 refills | Status: DC

## 2018-02-07 MED ORDER — ATORVASTATIN CALCIUM 20 MG PO TABS: 20.0000 mg | ORAL_TABLET | Freq: Every day | ORAL | 1 refills | Status: DC

## 2018-02-07 MED ORDER — GLUCOSE BLOOD VI STRP: ORAL_STRIP | 12 refills | Status: DC

## 2018-02-07 NOTE — Progress Notes (Signed)
Jasmine Lukes, MD  Magdalene Molly, RMA        She has not been taking her Atorvastatin, Metformin or Glipizide. Please send in all 3 with 90 day supply with 1 rf. She should restart all 3 because her sugar is worse. If she is not willing she should restart Atorvastatin and metformin first and then Glipizide next month. hgba1c minimize simple carbs. Increase exercise as tolerated.    I have sent these 3 Rx's in as stated above/LMOVM giving instructions and also advised that if needs to stagger them she can start the Atorvastatin and Metformin 1st then add back the Glipizide after 30d if needs to do that as an option/advised to call with any questions/thx dmf

## 2018-02-07 NOTE — Telephone Encounter (Signed)
Copied from [place] [phone]. Topic: Quick Communication - See Telephone Encounter >> Feb 06, 2018 10:57 AM Hewitt Shorts wrote: CRM for notification. See Telephone encounter for: pt pharmacy [facility] called stating that the accu check guide meter was given to patient but there is no rx for lancets or test strips for this accu check guide meter and also she has not taken metformin since 2017 and if she is supposed to be back on it the pharmacy needs and rx for that as well  Best number for [facility] is [phone]  02/06/18. >> Feb 07, 2018  8:20 AM Boyd Kerbs wrote: Pt. Called returning a phone call.  Call pt. Back

## 2018-02-07 NOTE — Telephone Encounter (Signed)
Sent medications in.

## 2018-03-10 ENCOUNTER — Encounter: Payer: Self-pay | Admitting: Genetics

## 2018-05-09 ENCOUNTER — Ambulatory Visit: Payer: Medicare Other | Admitting: Family Medicine

## 2018-05-09 DIAGNOSIS — E669 Obesity, unspecified: Secondary | ICD-10-CM

## 2018-05-09 DIAGNOSIS — E039 Hypothyroidism, unspecified: Secondary | ICD-10-CM | POA: Diagnosis not present

## 2018-05-09 DIAGNOSIS — E1169 Type 2 diabetes mellitus with other specified complication: Secondary | ICD-10-CM | POA: Diagnosis not present

## 2018-05-09 DIAGNOSIS — E782 Mixed hyperlipidemia: Secondary | ICD-10-CM | POA: Diagnosis not present

## 2018-05-09 LAB — CBC
HCT: 40.8 % (ref 36.0–46.0)
Hemoglobin: 13.7 g/dL (ref 12.0–15.0)
MCHC: 33.5 g/dL (ref 30.0–36.0)
MCV: 91.8 fl (ref 78.0–100.0)
PLATELETS: 213 10*3/uL (ref 150.0–400.0)
RBC: 4.45 Mil/uL (ref 3.87–5.11)
RDW: 13.6 % (ref 11.5–15.5)
WBC: 4.6 10*3/uL (ref 4.0–10.5)

## 2018-05-09 LAB — COMPREHENSIVE METABOLIC PANEL
ALBUMIN: 4 g/dL (ref 3.5–5.2)
ALK PHOS: 57 U/L (ref 39–117)
ALT: 35 U/L (ref 0–35)
AST: 43 U/L — ABNORMAL HIGH (ref 0–37)
BILIRUBIN TOTAL: 0.7 mg/dL (ref 0.2–1.2)
BUN: 12 mg/dL (ref 6–23)
CO2: 32 mEq/L (ref 19–32)
Calcium: 9.4 mg/dL (ref 8.4–10.5)
Chloride: 100 mEq/L (ref 96–112)
Creatinine, Ser: 0.68 mg/dL (ref 0.40–1.20)
GFR: 91.77 mL/min (ref >60.00)
GLUCOSE: 189 mg/dL — AB (ref 70–99)
Potassium: 4.5 mEq/L (ref 3.5–5.1)
Sodium: 139 mEq/L (ref 135–145)
TOTAL PROTEIN: 7.2 g/dL (ref 6.0–8.3)

## 2018-05-09 LAB — TSH: TSH: 1.2 u[IU]/mL (ref 0.35–4.50)

## 2018-05-09 LAB — LDL CHOLESTEROL, DIRECT: Direct LDL: 145 mg/dL

## 2018-05-09 LAB — LIPID PANEL
Cholesterol: 230 mg/dL — ABNORMAL HIGH (ref 0–200)
HDL: 56 mg/dL (ref >39.00)
NONHDL: 173.55
Total CHOL/HDL Ratio: 4
Triglycerides: 252 mg/dL — ABNORMAL HIGH (ref 0.0–149.0)
VLDL: 50.4 mg/dL — ABNORMAL HIGH (ref 0.0–40.0)

## 2018-05-09 LAB — HEMOGLOBIN A1C: HEMOGLOBIN A1C: 9.7 % — AB (ref 4.6–6.5)

## 2018-05-09 NOTE — Assessment & Plan Note (Signed)
On Levothyroxine, continue to monitor 

## 2018-05-09 NOTE — Assessment & Plan Note (Signed)
Tolerating statin, encouraged heart healthy diet, avoid trans fats, minimize simple carbs and saturated fats. Increase exercise as tolerated 

## 2018-05-09 NOTE — Progress Notes (Signed)
Subjective:  I acted as a Education administrator for Dr. Charlett Blake. [place], [place]  Patient ID: Jasmine Morse, female    DOB: 12-14-1951, 67 y.o.   MRN: [id number]  No chief complaint on file.   HPI  Patient is in today for a 3 month follow up and she continues to have a great deal of stress. Her husband's injuries from his MVA have been slowly improving and then recently he had a stroke so now he is fully dependent again. His memory and behavior have been affected. He calls her 60 times a day when she tries to go out. He is very attached to her and needs to know where she is. She has been checking her sugars and numbers have ranged from 120 to 210 with a 170 this am. No c/o polyuria or polydipsia. No recent febrile illness or hospitalizations. Denies CP/palp/SOB/HA/congestion/fevers/GI or GU c/o. Not taking meds as prescribed. She is taking the Lisinopril but is only taking Metfromin   Patient Care Team: Mosie Lukes, MD as PCP - General (Family Medicine)   Past Medical History:  Diagnosis Date  . Allergic state 08/10/2014  . Cancer ([place])    colon cancer stage III (T3, N2b) s/p right hemicolectomy, FOLFOX-- Dr Learta Codding  . Diabetes mellitus    type II  . Hair loss 07/10/2015  . Obesity, unspecified 08/10/2014  . Preventative health care 09/09/2017  . Thyroid disease    hypothyroidism    Past Surgical History:  Procedure Laterality Date  . CESAREAN SECTION     x 3  . INNER EAR SURGERY     due to numerous ear infections  . TONSILLECTOMY      Family History  Problem Relation Age of Onset  . Hypertension Son   . Cancer Father        colon  . Cancer Brother        colon  . Diabetes Other   . Hypertension Other   . Stroke Other     Social History   Socioeconomic History  . Marital status: Widowed    Spouse name: Not on file  . Number of children: 3  . Years of education: Not on file  . Highest education level: Not on file  Occupational History  . Occupation: [employer]    Employer:  [employer] [place]  Social Needs  . Financial resource strain: Not on file  . Food insecurity:    Worry: Not on file    Inability: Not on file  . Transportation needs:    Medical: Not on file    Non-medical: Not on file  Tobacco Use  . Smoking status: Never Smoker  . Smokeless tobacco: Never Used  Substance and Sexual Activity  . Alcohol use: No  . Drug use: No  . Sexual activity: Not on file  Lifestyle  . Physical activity:    Days per week: Not on file    Minutes per session: Not on file  . Stress: Not on file  Relationships  . Social connections:    Talks on phone: Not on file    Gets together: Not on file    Attends religious service: Not on file    Active member of club or organization: Not on file    Attends meetings of clubs or organizations: Not on file    Relationship status: Not on file  . Intimate partner violence:    Fear of current or ex partner: Not on file  Emotionally abused: Not on file    Physically abused: Not on file    Forced sexual activity: Not on file  Other Topics Concern  . Not on file  Social History Narrative  . Not on file    Outpatient Medications Prior to Visit  Medication Sig Dispense Refill  . atorvastatin (LIPITOR) 20 MG tablet Take 1 tablet (20 mg total) by mouth daily. 90 tablet 1  . EZ SMART BLOOD GLUCOSE LANCETS MISC 1 strip by Does not apply route daily. 100 each 12  . glipiZIDE (GLUCOTROL) 5 MG tablet Take 1 tablet (5 mg total) by mouth daily. 90 tablet 1  . glucose blood test strip Use as instructed 100 each 12  . levothyroxine (SYNTHROID, LEVOTHROID) 88 MCG tablet TAKE 1 TABLET BY MOUTH ONCE DAILY BEFOREBREAKFAST 90 tablet 1  . lisinopril (PRINIVIL,ZESTRIL) 5 MG tablet Take 1 tablet (5 mg total) by mouth daily. 90 tablet 3  . metFORMIN (GLUCOPHAGE) 1000 MG tablet Take 1 tablet (1,000 mg total) by mouth 2 (two) times daily with a meal. 180 tablet 1   No facility-administered medications prior to visit.     Allergies    Allergen Reactions  . Codeine     REACTION: Nausea \\T \ Diarrhea    Review of Systems  Constitutional: Positive for malaise/fatigue. Negative for fever.  HENT: Negative for congestion.   Eyes: Negative for blurred vision.  Respiratory: Negative for shortness of breath.   Cardiovascular: Negative for chest pain, palpitations and leg swelling.  Gastrointestinal: Negative for abdominal pain, blood in stool and nausea.  Genitourinary: Negative for dysuria and frequency.  Musculoskeletal: Negative for falls.  Skin: Negative for rash.  Neurological: Negative for dizziness, loss of consciousness and headaches.  Endo/Heme/Allergies: Negative for environmental allergies.  Psychiatric/Behavioral: Negative for depression. The patient is not nervous/anxious.        Objective:    Physical Exam  Constitutional: She is oriented to person, place, and time. She appears well-developed and well-nourished. No distress.  HENT:  Head: Normocephalic and atraumatic.  Nose: Nose normal.  Eyes: Right eye exhibits no discharge. Left eye exhibits no discharge.  Neck: Normal range of motion. Neck supple.  Cardiovascular: Normal rate and regular rhythm.  No murmur heard. Pulmonary/Chest: Effort normal and breath sounds normal.  Abdominal: Soft. Bowel sounds are normal. There is no tenderness.  Genitourinary: Uterus normal.  Musculoskeletal: She exhibits no edema.  Neurological: She is alert and oriented to person, place, and time.  Skin: Skin is warm and dry.  Psychiatric: She has a normal mood and affect.  Nursing note and vitals reviewed.   BP 122/70 (BP Location: Left Arm, Patient Position: Sitting, Cuff Size: Normal)   Pulse 64   Temp (!) 97.5 F (36.4 C) (Oral)   Resp 18   Wt 204 lb 6.4 oz (92.7 kg)   SpO2 98%   BMI 36.21 kg/m  Wt Readings from Last 3 Encounters:  05/09/18 204 lb 6.4 oz (92.7 kg)  02/06/18 208 lb 9.6 oz (94.6 kg)  09/09/17 209 lb (94.8 kg)   BP Readings from Last 3  Encounters:  05/09/18 122/70  02/06/18 (!) 142/76  09/09/17 118/68     Immunization History  Administered Date(s) Administered  . Influenza Split 01/13/2013  . Influenza Whole 11/01/2009  . Pneumococcal Polysaccharide-23 11/01/2009    Health Maintenance  Topic Date Due  . OPHTHALMOLOGY EXAM  07/07/1961  . DEXA SCAN  07/07/2016  . PNA vac Low Risk Adult (1 of 2 -  PCV13) 07/07/2016  . FOOT EXAM  01/15/2017  . MAMMOGRAM  01/15/2018  . INFLUENZA VACCINE  10/09/2018 (Originally 07/31/2018)  . HEMOGLOBIN A1C  08/06/2018  . COLONOSCOPY  04/02/2022  . TETANUS/TDAP  01/15/2026  . Hepatitis C Screening  Addressed    Lab Results  Component Value Date   WBC 5.1 05/06/2017   HGB 13.6 05/06/2017   HCT 41.0 05/06/2017   PLT 222.0 05/06/2017   GLUCOSE 362 (H) 02/06/2018   CHOL 245 (H) 02/06/2018   TRIG 266.0 (H) 02/06/2018   HDL 55.80 02/06/2018   LDLDIRECT 162.0 02/06/2018   LDLCALC NOT CALC 06/24/2015   ALT 47 (H) 02/06/2018   AST 49 (H) 02/06/2018   NA 134 (L) 02/06/2018   K 4.2 02/06/2018   CL 95 (L) 02/06/2018   CREATININE 0.76 02/06/2018   BUN 12 02/06/2018   CO2 33 (H) 02/06/2018   TSH 1.88 05/06/2017   HGBA1C 12.4 (H) 02/06/2018   MICROALBUR <0.7 02/06/2018    Lab Results  Component Value Date   TSH 1.88 05/06/2017   Lab Results  Component Value Date   WBC 5.1 05/06/2017   HGB 13.6 05/06/2017   HCT 41.0 05/06/2017   MCV 92.4 05/06/2017   PLT 222.0 05/06/2017   Lab Results  Component Value Date   NA 134 (L) 02/06/2018   K 4.2 02/06/2018   CO2 33 (H) 02/06/2018   GLUCOSE 362 (H) 02/06/2018   BUN 12 02/06/2018   CREATININE 0.76 02/06/2018   BILITOT 0.6 02/06/2018   ALKPHOS 70 02/06/2018   AST 49 (H) 02/06/2018   ALT 47 (H) 02/06/2018   PROT 7.4 02/06/2018   ALBUMIN 3.9 02/06/2018   CALCIUM 9.2 02/06/2018   GFR 80.78 02/06/2018   Lab Results  Component Value Date   CHOL 245 (H) 02/06/2018   Lab Results  Component Value Date   HDL 55.80  02/06/2018   Lab Results  Component Value Date   LDLCALC NOT CALC 06/24/2015   Lab Results  Component Value Date   TRIG 266.0 (H) 02/06/2018   Lab Results  Component Value Date   CHOLHDL 4 02/06/2018   Lab Results  Component Value Date   HGBA1C 12.4 (H) 02/06/2018         Assessment & Plan:   Problem List Items Addressed This Visit    Hypothyroidism    On Levothyroxine, continue to monitor      Relevant Orders   CBC   TSH   Diabetes mellitus type 2 in obese ([place])    hgba1c unacceptable in February, minimize simple carbs. Increase exercise as tolerated. Recheck hgba1c today. Has only been taking the Metformin once daily and often not taking the Glipizide.       Relevant Orders   Hemoglobin A1c   Comprehensive metabolic panel   CBC   Hyperlipidemia    Tolerating statin, encouraged heart healthy diet, avoid trans fats, minimize simple carbs and saturated fats. Increase exercise as tolerated      Relevant Orders   CBC   Lipid panel      I am having [place] maintain her levothyroxine, lisinopril, atorvastatin, metFORMIN, glipiZIDE, glucose blood, and EZ SMART BLOOD GLUCOSE LANCETS.  No orders of the defined types were placed in this encounter.   CMA served as Education administrator during this visit. History, Physical and Plan performed by medical provider. Documentation and orders reviewed and attested to.  Penni Homans, MD

## 2018-05-09 NOTE — Patient Instructions (Signed)

## 2018-05-09 NOTE — Assessment & Plan Note (Addendum)
hgba1c unacceptable in February, minimize simple carbs. Increase exercise as tolerated. Recheck hgba1c today. Has only been taking the Metformin once daily and often not taking the Glipizide.

## 2018-07-18 ENCOUNTER — Telehealth: Payer: Self-pay | Admitting: Family Medicine

## 2018-07-18 DIAGNOSIS — E038 Other specified hypothyroidism: Secondary | ICD-10-CM

## 2018-07-18 DIAGNOSIS — E785 Hyperlipidemia, unspecified: Secondary | ICD-10-CM

## 2018-07-18 DIAGNOSIS — E039 Hypothyroidism, unspecified: Secondary | ICD-10-CM

## 2018-07-18 DIAGNOSIS — T7840XA Allergy, unspecified, initial encounter: Secondary | ICD-10-CM

## 2018-07-18 NOTE — Telephone Encounter (Signed)
Copied from [place] [id number]. Topic: Quick Communication - Rx Refill/Question >> Jul 18, 2018  3:21 PM Bea Graff, NT wrote: Medication: levothyroxine (SYNTHROID, LEVOTHROID) 88 MCG tablet   Has the patient contacted their pharmacy? Yes.   (Agent: If no, request that the patient contact the pharmacy for the refill.) (Agent: If yes, when and what did the pharmacy advise?)  Preferred Pharmacy (with phone number or street name): [place], [place] - [address] [phone] (Phone) [phone] (Fax)      Agent: Please be advised that RX refills may take up to 3 business days. We ask that you follow-up with your pharmacy.

## 2018-07-21 MED ORDER — LEVOTHYROXINE SODIUM 88 MCG PO TABS: ORAL_TABLET | ORAL | 1 refills | Status: DC

## 2018-07-21 NOTE — Telephone Encounter (Signed)
Medication sent in. 

## 2018-08-14 ENCOUNTER — Ambulatory Visit: Payer: Medicare Other | Admitting: Family Medicine

## 2018-09-04 ENCOUNTER — Ambulatory Visit: Payer: Medicare Other | Admitting: Family Medicine

## 2018-09-04 ENCOUNTER — Encounter: Payer: Self-pay | Admitting: Family Medicine

## 2018-09-04 DIAGNOSIS — E782 Mixed hyperlipidemia: Secondary | ICD-10-CM | POA: Diagnosis not present

## 2018-09-04 DIAGNOSIS — E669 Obesity, unspecified: Secondary | ICD-10-CM

## 2018-09-04 DIAGNOSIS — E039 Hypothyroidism, unspecified: Secondary | ICD-10-CM | POA: Diagnosis not present

## 2018-09-04 DIAGNOSIS — E6609 Other obesity due to excess calories: Secondary | ICD-10-CM

## 2018-09-04 DIAGNOSIS — E1169 Type 2 diabetes mellitus with other specified complication: Secondary | ICD-10-CM

## 2018-09-04 LAB — COMPREHENSIVE METABOLIC PANEL
ALBUMIN: 4.3 g/dL (ref 3.5–5.2)
ALK PHOS: 62 U/L (ref 39–117)
ALT: 41 U/L — ABNORMAL HIGH (ref 0–35)
AST: 36 U/L (ref 0–37)
BUN: 14 mg/dL (ref 6–23)
CHLORIDE: 100 meq/L (ref 96–112)
CO2: 33 mEq/L — ABNORMAL HIGH (ref 19–32)
Calcium: 9.8 mg/dL (ref 8.4–10.5)
Creatinine, Ser: 0.72 mg/dL (ref 0.40–1.20)
GFR: 85.83 mL/min (ref >60.00)
Glucose, Bld: 112 mg/dL — ABNORMAL HIGH (ref 70–99)
POTASSIUM: 4.5 meq/L (ref 3.5–5.1)
SODIUM: 139 meq/L (ref 135–145)
TOTAL PROTEIN: 7.2 g/dL (ref 6.0–8.3)
Total Bilirubin: 0.4 mg/dL (ref 0.2–1.2)

## 2018-09-04 LAB — CBC
HEMATOCRIT: 39.7 % (ref 36.0–46.0)
HEMOGLOBIN: 13.5 g/dL (ref 12.0–15.0)
MCHC: 34.1 g/dL (ref 30.0–36.0)
MCV: 91.3 fl (ref 78.0–100.0)
Platelets: 242 10*3/uL (ref 150.0–400.0)
RBC: 4.35 Mil/uL (ref 3.87–5.11)
RDW: 12.8 % (ref 11.5–15.5)
WBC: 5.8 10*3/uL (ref 4.0–10.5)

## 2018-09-04 LAB — HEMOGLOBIN A1C: Hgb A1c MFr Bld: 7.5 % — ABNORMAL HIGH (ref 4.6–6.5)

## 2018-09-04 LAB — LIPID PANEL
CHOLESTEROL: 232 mg/dL — AB (ref 0–200)
HDL: 58.9 mg/dL (ref >39.00)
NONHDL: 173.1
TRIGLYCERIDES: 341 mg/dL — AB (ref 0.0–149.0)
Total CHOL/HDL Ratio: 4
VLDL: 68.2 mg/dL — ABNORMAL HIGH (ref 0.0–40.0)

## 2018-09-04 LAB — TSH: TSH: 1.98 u[IU]/mL (ref 0.35–4.50)

## 2018-09-04 LAB — LDL CHOLESTEROL, DIRECT: Direct LDL: 156 mg/dL

## 2018-09-04 MED ORDER — LISINOPRIL 5 MG PO TABS: 5.0000 mg | ORAL_TABLET | Freq: Every day | ORAL | 3 refills | Status: DC

## 2018-09-04 NOTE — Assessment & Plan Note (Signed)
hgba1c acceptable, minimize simple carbs. Increase exercise as tolerated.  

## 2018-09-04 NOTE — Assessment & Plan Note (Signed)
On Levothyroxine, continue to monitor 

## 2018-09-04 NOTE — Assessment & Plan Note (Signed)
Encouraged DASH diet, decrease po intake and increase exercise as tolerated. Needs 7-8 hours of sleep nightly. Avoid trans fats, eat small, frequent meals every 4-5 hours with lean proteins, complex carbs and healthy fats. Minimize simple carbs 

## 2018-09-04 NOTE — Patient Instructions (Signed)
shingrix is the new shingles shot 2 shots over 2-6 months at pharmacy  Carbohydrate Counting for Diabetes Mellitus, Adult Carbohydrate counting is a method for keeping track of how many carbohydrates you eat. Eating carbohydrates naturally increases the amount of sugar (glucose) in the blood. Counting how many carbohydrates you eat helps keep your blood glucose within normal limits, which helps you manage your diabetes (diabetes mellitus). It is important to know how many carbohydrates you can safely have in each meal. This is different for every person. A diet and nutrition specialist (registered dietitian) can help you make a meal plan and calculate how many carbohydrates you should have at each meal and snack. Carbohydrates are found in the following foods:  Grains, such as breads and cereals.  Dried beans and soy products.  Starchy vegetables, such as potatoes, peas, and corn.  Fruit and fruit juices.  Milk and yogurt.  Sweets and snack foods, such as cake, cookies, candy, chips, and soft drinks.  How do I count carbohydrates? There are two ways to count carbohydrates in food. You can use either of the methods or a combination of both. Reading "Nutrition Facts" on packaged food The "Nutrition Facts" list is included on the labels of almost all packaged foods and beverages in the [place] It includes:  The serving size.  Information about nutrients in each serving, including the grams (g) of carbohydrate per serving.  To use the "Nutrition Facts":  Decide how many servings you will have.  Multiply the number of servings by the number of carbohydrates per serving.  The resulting number is the total amount of carbohydrates that you will be having.  Learning standard serving sizes of other foods When you eat foods containing carbohydrates that are not packaged or do not include "Nutrition Facts" on the label, you need to measure the servings in order to count the amount of  carbohydrates:  Measure the foods that you will eat with a food scale or measuring cup, if needed.  Decide how many standard-size servings you will eat.  Multiply the number of servings by 15. Most carbohydrate-rich foods have about 15 g of carbohydrates per serving. ? For example, if you eat 8 oz (170 g) of strawberries, you will have eaten 2 servings and 30 g of carbohydrates (2 servings x 15 g = 30 g).  For foods that have more than one food mixed, such as soups and casseroles, you must count the carbohydrates in each food that is included.  The following list contains standard serving sizes of common carbohydrate-rich foods. Each of these servings has about 15 g of carbohydrates:   hamburger bun or  English muffin.   oz (15 mL) syrup.   oz (14 g) jelly.  1 slice of bread.  1 six-inch tortilla.  3 oz (85 g) cooked rice or pasta.  4 oz (113 g) cooked dried beans.  4 oz (113 g) starchy vegetable, such as peas, corn, or potatoes.  4 oz (113 g) hot cereal.  4 oz (113 g) mashed potatoes or  of a large baked potato.  4 oz (113 g) canned or frozen fruit.  4 oz (120 mL) fruit juice.  4-6 crackers.  6 chicken nuggets.  6 oz (170 g) unsweetened dry cereal.  6 oz (170 g) plain fat-free yogurt or yogurt sweetened with artificial sweeteners.  8 oz (240 mL) milk.  8 oz (170 g) fresh fruit or one small piece of fruit.  24 oz (680 g) popped popcorn.  Example of carbohydrate counting Sample meal  3 oz (85 g) chicken breast.  6 oz (170 g) brown rice.  4 oz (113 g) corn.  8 oz (240 mL) milk.  8 oz (170 g) strawberries with sugar-free whipped topping. Carbohydrate calculation 1. Identify the foods that contain carbohydrates: ? Rice. ? Corn. ? Milk. ? Strawberries. 2. Calculate how many servings you have of each food: ? 2 servings rice. ? 1 serving corn. ? 1 serving milk. ? 1 serving strawberries. 3. Multiply each number of servings by 15 g: ? 2 servings  rice x 15 g = 30 g. ? 1 serving corn x 15 g = 15 g. ? 1 serving milk x 15 g = 15 g. ? 1 serving strawberries x 15 g = 15 g. 4. Add together all of the amounts to find the total grams of carbohydrates eaten: ? 30 g + 15 g + 15 g + 15 g = 75 g of carbohydrates total. This information is not intended to replace advice given to you by your health care provider. Make sure you discuss any questions you have with your health care provider. Document Released: 12/17/2005 Document Revised: 07/06/2016 Document Reviewed: 05/30/2016 Elsevier Interactive Patient Education  2018 Elsevier Inc.  

## 2018-09-04 NOTE — Progress Notes (Signed)
Subjective:  I acted as a Education administrator for Dr. Charlett Blake. [place], [place]  Patient ID: Jasmine Morse, female    DOB: 15-Mar-1951, 67 y.o.   MRN: [id number]  No chief complaint on file.   HPI  Patient is in today for a 3 month follow up and overall she is doing well. No recent febrile illness or hospitalizations. No c/o polyuria or polydipsia. Well controlled, no changes to meds. Denies CP/palp/SOB/HA/congestion/fevers/GI or GU c/o. Taking meds as prescribed. Her sugars have been good 70s to 110s. Has had just a couple issues of shakiness but she did not check her sugars when this happens.  Patient Care Team: Mosie Lukes, MD as PCP - General (Family Medicine)   Past Medical History:  Diagnosis Date  . Allergic state 08/10/2014  . Cancer ([place])    colon cancer stage III (T3, N2b) s/p right hemicolectomy, FOLFOX-- Dr Learta Codding  . Diabetes mellitus    type II  . Hair loss 07/10/2015  . Obesity, unspecified 08/10/2014  . Preventative health care 09/09/2017  . Thyroid disease    hypothyroidism    Past Surgical History:  Procedure Laterality Date  . CESAREAN SECTION     x 3  . INNER EAR SURGERY     due to numerous ear infections  . TONSILLECTOMY      Family History  Problem Relation Age of Onset  . Hypertension Son   . Cancer Father        colon  . Cancer Brother        colon  . Diabetes Other   . Hypertension Other   . Stroke Other     Social History   Socioeconomic History  . Marital status: Widowed    Spouse name: Not on file  . Number of children: 3  . Years of education: Not on file  . Highest education level: Not on file  Occupational History  . Occupation: [employer]    Employer: [employer]  Social Needs  . Financial resource strain: Not on file  . Food insecurity:    Worry: Not on file    Inability: Not on file  . Transportation needs:    Medical: Not on file    Non-medical: Not on file  Tobacco Use  . Smoking status: Never Smoker  . Smokeless  tobacco: Never Used  Substance and Sexual Activity  . Alcohol use: No  . Drug use: No  . Sexual activity: Not on file  Lifestyle  . Physical activity:    Days per week: Not on file    Minutes per session: Not on file  . Stress: Not on file  Relationships  . Social connections:    Talks on phone: Not on file    Gets together: Not on file    Attends religious service: Not on file    Active member of club or organization: Not on file    Attends meetings of clubs or organizations: Not on file    Relationship status: Not on file  . Intimate partner violence:    Fear of current or ex partner: Not on file    Emotionally abused: Not on file    Physically abused: Not on file    Forced sexual activity: Not on file  Other Topics Concern  . Not on file  Social History Narrative  . Not on file    Outpatient Medications Prior to Visit  Medication Sig Dispense Refill  . atorvastatin (LIPITOR) 20 MG tablet  Take 1 tablet (20 mg total) by mouth daily. 90 tablet 1  . EZ SMART BLOOD GLUCOSE LANCETS MISC 1 strip by Does not apply route daily. 100 each 12  . glipiZIDE (GLUCOTROL) 5 MG tablet Take 1 tablet (5 mg total) by mouth daily. 90 tablet 1  . glucose blood test strip Use as instructed 100 each 12  . levothyroxine (SYNTHROID, LEVOTHROID) 88 MCG tablet TAKE 1 TABLET BY MOUTH ONCE DAILY BEFOREBREAKFAST 90 tablet 1  . metFORMIN (GLUCOPHAGE) 1000 MG tablet Take 1 tablet (1,000 mg total) by mouth 2 (two) times daily with a meal. 180 tablet 1  . lisinopril (PRINIVIL,ZESTRIL) 5 MG tablet Take 1 tablet (5 mg total) by mouth daily. 90 tablet 3   No facility-administered medications prior to visit.     Allergies  Allergen Reactions  . Codeine     REACTION: Nausea \\T \ Diarrhea    Review of Systems  Constitutional: Negative for fever and malaise/fatigue.  HENT: Negative for congestion.   Eyes: Negative for blurred vision.  Respiratory: Negative for shortness of breath.   Cardiovascular:  Negative for chest pain, palpitations and leg swelling.  Gastrointestinal: Negative for abdominal pain, blood in stool and nausea.  Genitourinary: Negative for dysuria and frequency.  Musculoskeletal: Negative for falls.  Skin: Negative for rash.  Neurological: Negative for dizziness, loss of consciousness and headaches.  Endo/Heme/Allergies: Negative for environmental allergies.  Psychiatric/Behavioral: Negative for depression. The patient is not nervous/anxious.        Objective:    Physical Exam  Constitutional: She is oriented to person, place, and time. She appears well-developed and well-nourished. No distress.  HENT:  Head: Normocephalic and atraumatic.  Eyes: Conjunctivae are normal.  Neck: Neck supple. No thyromegaly present.  Cardiovascular: Normal rate, regular rhythm and normal heart sounds.  No murmur heard. Pulmonary/Chest: Effort normal and breath sounds normal. No respiratory distress.  Abdominal: Soft. Bowel sounds are normal. She exhibits no distension and no mass. There is no tenderness.  Musculoskeletal: She exhibits no edema.  Lymphadenopathy:    She has no cervical adenopathy.  Neurological: She is alert and oriented to person, place, and time.  Skin: Skin is warm and dry.  Psychiatric: She has a normal mood and affect. Her behavior is normal.    BP 120/70 (BP Location: Left Arm, Patient Position: Sitting, Cuff Size: Normal)   Pulse 67   Temp 97.8 F (36.6 C) (Oral)   Resp 18   Ht 5\' 3"  (1.6 m)   Wt 214 lb 12.8 oz (97.4 kg)   SpO2 97%   BMI 38.05 kg/m  Wt Readings from Last 3 Encounters:  09/04/18 214 lb 12.8 oz (97.4 kg)  05/09/18 204 lb 6.4 oz (92.7 kg)  02/06/18 208 lb 9.6 oz (94.6 kg)   BP Readings from Last 3 Encounters:  09/04/18 120/70  05/09/18 122/70  02/06/18 (!) 142/76     Immunization History  Administered Date(s) Administered  . Influenza Split 01/13/2013  . Influenza Whole 11/01/2009  . Pneumococcal Polysaccharide-23  11/01/2009    Health Maintenance  Topic Date Due  . OPHTHALMOLOGY EXAM  07/07/1961  . DEXA SCAN  07/07/2016  . PNA vac Low Risk Adult (1 of 2 - PCV13) 07/07/2016  . FOOT EXAM  01/15/2017  . MAMMOGRAM  01/15/2018  . INFLUENZA VACCINE  10/09/2018 (Originally 07/31/2018)  . HEMOGLOBIN A1C  11/09/2018  . COLONOSCOPY  04/02/2022  . TETANUS/TDAP  01/15/2026  . Hepatitis C Screening  Addressed    Lab  Results  Component Value Date   WBC 4.6 05/09/2018   HGB 13.7 05/09/2018   HCT 40.8 05/09/2018   PLT 213.0 05/09/2018   GLUCOSE 189 (H) 05/09/2018   CHOL 230 (H) 05/09/2018   TRIG 252.0 (H) 05/09/2018   HDL 56.00 05/09/2018   LDLDIRECT 145.0 05/09/2018   LDLCALC NOT CALC 06/24/2015   ALT 35 05/09/2018   AST 43 (H) 05/09/2018   NA 139 05/09/2018   K 4.5 05/09/2018   CL 100 05/09/2018   CREATININE 0.68 05/09/2018   BUN 12 05/09/2018   CO2 32 05/09/2018   TSH 1.20 05/09/2018   HGBA1C 9.7 (H) 05/09/2018   MICROALBUR <0.7 02/06/2018    Lab Results  Component Value Date   TSH 1.20 05/09/2018   Lab Results  Component Value Date   WBC 4.6 05/09/2018   HGB 13.7 05/09/2018   HCT 40.8 05/09/2018   MCV 91.8 05/09/2018   PLT 213.0 05/09/2018   Lab Results  Component Value Date   NA 139 05/09/2018   K 4.5 05/09/2018   CO2 32 05/09/2018   GLUCOSE 189 (H) 05/09/2018   BUN 12 05/09/2018   CREATININE 0.68 05/09/2018   BILITOT 0.7 05/09/2018   ALKPHOS 57 05/09/2018   AST 43 (H) 05/09/2018   ALT 35 05/09/2018   PROT 7.2 05/09/2018   ALBUMIN 4.0 05/09/2018   CALCIUM 9.4 05/09/2018   GFR 91.77 05/09/2018   Lab Results  Component Value Date   CHOL 230 (H) 05/09/2018   Lab Results  Component Value Date   HDL 56.00 05/09/2018   Lab Results  Component Value Date   LDLCALC NOT CALC 06/24/2015   Lab Results  Component Value Date   TRIG 252.0 (H) 05/09/2018   Lab Results  Component Value Date   CHOLHDL 4 05/09/2018   Lab Results  Component Value Date   HGBA1C 9.7 (H)  05/09/2018         Assessment & Plan:   Problem List Items Addressed This Visit    None      I am having Jasmine Morse maintain her atorvastatin, metFORMIN, glipiZIDE, glucose blood, EZ SMART BLOOD GLUCOSE LANCETS, levothyroxine, and lisinopril.  Meds ordered this encounter  Medications  . lisinopril (PRINIVIL,ZESTRIL) 5 MG tablet    Sig: Take 1 tablet (5 mg total) by mouth daily.    Dispense:  90 tablet    Refill:  3    CMA served as scribe during this visit. History, Physical and Plan performed by medical provider. Documentation and orders reviewed and attested to.  Magdalene Molly, [place]

## 2018-09-04 NOTE — Assessment & Plan Note (Signed)
Tolerating statin, encouraged heart healthy diet, avoid trans fats, minimize simple carbs and saturated fats. Increase exercise as tolerated 

## 2018-09-05 MED ORDER — ATORVASTATIN CALCIUM 10 MG PO TABS: 10.0000 mg | ORAL_TABLET | Freq: Every day | ORAL | 3 refills | Status: DC

## 2018-09-05 NOTE — Addendum Note (Signed)
Addended by: Magdalene Molly A on: 09/05/2018 04:46 PM   Modules accepted: Orders

## 2018-09-10 ENCOUNTER — Telehealth: Payer: Self-pay | Admitting: *Deleted

## 2018-09-10 NOTE — Telephone Encounter (Signed)
Faxed refill request received from [facility] for Alcohol Prep Pads "many Medicare Part D plans are now covering prep pads at little or no cost to the patient". Request prescription for alcohol prep pads for patient to be sent to pharmacy in order to bill Medicare and save the patient this out-of-pocket cost, completed as much as possible; forwarded to provider/SLS 09/11

## 2018-11-10 ENCOUNTER — Telehealth: Payer: Self-pay

## 2018-11-10 DIAGNOSIS — E038 Other specified hypothyroidism: Secondary | ICD-10-CM

## 2018-11-10 DIAGNOSIS — E785 Hyperlipidemia, unspecified: Secondary | ICD-10-CM

## 2018-11-10 DIAGNOSIS — E669 Obesity, unspecified: Secondary | ICD-10-CM

## 2018-11-10 DIAGNOSIS — T7840XA Allergy, unspecified, initial encounter: Secondary | ICD-10-CM

## 2018-11-10 MED ORDER — METFORMIN HCL 1000 MG PO TABS: 1000.0000 mg | ORAL_TABLET | Freq: Two times a day (BID) | ORAL | 1 refills | Status: DC

## 2018-11-10 NOTE — Telephone Encounter (Signed)
Paper rx request for metformin received; refilled per protocol.

## 2019-01-02 NOTE — Progress Notes (Addendum)
Subjective:   Jasmine Morse is a 68 y.o. female who presents for an Initial Medicare Annual Wellness Visit.  Review of Systems    No ROS.  Medicare Wellness Visit. Additional risk factors are reflected in the social history.  Sleep patterns: sleeps at least 6 hrs at night.  Home Safety/Smoke Alarms: Lives with husband in 1 story home. Step-over tub.   Female:   Pap-declines        Mammo- declines Dexa scan- declines CCS-declines Cardiac Risk Factors include: advanced age (>62men, >57 women);diabetes mellitus;dyslipidemia;obesity (BMI >30kg/m2) Eye- pt declines doing diabetic eye exam. Wears readers.    Objective:    Today's Vitals   01/05/19 1329 01/05/19 1330  BP: (!) 160/100 (!) 150/90  Pulse: 72   SpO2: 97%   Weight: 213 lb 3.2 oz (96.7 kg)   Height: 5\' 3"  (1.6 m)    Body mass index is 37.77 kg/m.  Advanced Directives 01/05/2019  Does Patient Have a Medical Advance Directive? No  Would patient like information on creating a medical advance directive? No - Patient declined    Current Medications (verified) Outpatient Encounter Medications as of 01/05/2019  Medication Sig  . glipiZIDE (GLUCOTROL) 5 MG tablet Take 1 tablet (5 mg total) by mouth daily.  Marland Kitchen levothyroxine (SYNTHROID, LEVOTHROID) 88 MCG tablet TAKE 1 TABLET BY MOUTH ONCE DAILY BEFOREBREAKFAST  . lisinopril (PRINIVIL,ZESTRIL) 5 MG tablet Take 1 tablet (5 mg total) by mouth daily.  Marland Kitchen atorvastatin (LIPITOR) 10 MG tablet Take 1 tablet (10 mg total) by mouth daily. (Patient not taking: Reported on 01/05/2019)  . EZ SMART BLOOD GLUCOSE LANCETS MISC 1 strip by Does not apply route daily. (Patient not taking: Reported on 01/05/2019)  . glucose blood test strip Use as instructed (Patient not taking: Reported on 01/05/2019)  . metFORMIN (GLUCOPHAGE) 1000 MG tablet Take 1 tablet (1,000 mg total) by mouth 2 (two) times daily with a meal.   No facility-administered encounter medications on file as of 01/05/2019.     Allergies  (verified) Codeine   History: Past Medical History:  Diagnosis Date  . Allergic state 08/10/2014  . Cancer ([place])    colon cancer stage III (T3, N2b) s/p right hemicolectomy, FOLFOX-- Dr Learta Codding  . Diabetes mellitus    type II  . Hair loss 07/10/2015  . Obesity, unspecified 08/10/2014  . Preventative health care 09/09/2017  . Thyroid disease    hypothyroidism   Past Surgical History:  Procedure Laterality Date  . CESAREAN SECTION     x 3  . INNER EAR SURGERY     due to numerous ear infections  . TONSILLECTOMY     Family History  Problem Relation Age of Onset  . Hypertension Son   . Cancer Father        colon  . Cancer Brother        colon  . Diabetes Other   . Hypertension Other   . Stroke Other    Social History   Socioeconomic History  . Marital status: Widowed    Spouse name: Not on file  . Number of children: 3  . Years of education: Not on file  . Highest education level: Not on file  Occupational History  . Occupation: [employer] M[employer]    Employer: [employer]  Social Needs  . Financial resource strain: Not on file  . Food insecurity:    Worry: Not on file    Inability: Not on file  . Transportation needs:  Medical: Not on file    Non-medical: Not on file  Tobacco Use  . Smoking status: Never Smoker  . Smokeless tobacco: Never Used  Substance and Sexual Activity  . Alcohol use: No  . Drug use: No  . Sexual activity: Yes  Lifestyle  . Physical activity:    Days per week: Not on file    Minutes per session: Not on file  . Stress: Not on file  Relationships  . Social connections:    Talks on phone: Not on file    Gets together: Not on file    Attends religious service: Not on file    Active member of club or organization: Not on file    Attends meetings of clubs or organizations: Not on file    Relationship status: Not on file  Other Topics Concern  . Not on file  Social History Narrative  . Not on file    Tobacco  Counseling Counseling given: Not Answered   Clinical Intake: Pain : No/denies pain    Activities of Daily Living In your present state of health, do you have any difficulty performing the following activities: 01/05/2019  Hearing? 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  Vision? N  Difficulty concentrating or making decisions? N  Walking or climbing stairs? N  Dressing or bathing? N  Doing errands, shopping? N  Preparing Food and eating ? N  Using the Toilet? N  In the past six months, have you accidently leaked urine? N  Do you have problems with loss of bowel control? N  Managing your Medications? N  Managing your Finances? N  Housekeeping or managing your Housekeeping? N  Some recent data might be hidden     Immunizations and Health Maintenance Immunization History  Administered Date(s) Administered  . Influenza Split 01/13/2013  . Influenza Whole 11/01/2009  . Pneumococcal Polysaccharide-23 11/01/2009   Health Maintenance Due  Topic Date Due  . FOOT EXAM  01/15/2017    Patient Care Team: Mosie Lukes, MD as PCP - General (Family Medicine)  Indicate any recent Medical Services you may have received from other than [facility] providers in the past year (date may be approximate).     Assessment:   This is a routine wellness examination for [facility]. Physical assessment deferred to PCP.   Hearing/Vision screen  Visual Acuity Screening   Right eye Left eye Both eyes  Without correction: 20/32 20/32 20/32   With correction:     Hearing Screening Comments: Able to hear conversational tones w/o difficulty. No issues reported.    Dietary issues and exercise activities discussed: Diet (meal preparation, eat out, water intake, caffeinated beverages, dairy products, fruits and vegetables): in general, a "healthy" diet  , well balanced   Current Exercise Habits: The patient does not participate in regular exercise at present, Exercise limited by:  None identified  Goals    . Increase physical activity      Depression Screen PHQ 2/9 Scores 01/05/2019 05/09/2018  PHQ - 2 Score 0 0    Fall Risk Fall Risk  01/05/2019 05/09/2018  Falls in the past year? 0 No      Cognitive Function: Ad8 score reviewed for issues:  Issues making decisions:no  Less interest in hobbies / activities:no  Repeats questions, stories (family complaining):no  Trouble using ordinary gadgets (microwave, computer, phone):no  Forgets the month or year: no  Mismanaging finances: no  Remembering appts:no  Daily problems with thinking and/or memory:no  Ad8 score is=no        Screening Tests Health Maintenance  Topic Date Due  . FOOT EXAM  01/15/2017  . OPHTHALMOLOGY EXAM  01/05/2019 (Originally 07/07/1961)  . INFLUENZA VACCINE  03/31/2019 (Originally 07/31/2018)  . MAMMOGRAM  01/06/2020 (Originally 01/15/2018)  . DEXA SCAN  01/06/2020 (Originally 07/07/2016)  . PNA vac Low Risk Adult (1 of 2 - PCV13) 01/06/2020 (Originally 07/07/2016)  . HEMOGLOBIN A1C  03/05/2019  . COLONOSCOPY  04/02/2022  . TETANUS/TDAP  01/15/2026  . Hepatitis C Screening  Addressed      Plan:    Please schedule your next medicare wellness visit with me in 1 yr.  Continue to eat heart healthy diet (full of fruits, vegetables, whole grains, lean protein, water--limit salt, fat, and sugar intake) and increase physical activity as tolerated.  Continue doing brain stimulating activities (puzzles, reading, adult coloring books, staying active) to keep memory sharp.   BP is elevated- will discuss with PCP in appointment directly following this appt.  I have personally reviewed and noted the following in the patient's chart:   . Medical and social history . Use of alcohol, tobacco or illicit drugs  . Current medications and supplements . Functional ability and status . Nutritional status . Physical activity . Advanced directives . List of other physicians . Hospitalizations,  surgeries, and ER visits in previous 12 months . Vitals . Screenings to include cognitive, depression, and falls . Referrals and appointments  In addition, I have reviewed and discussed with patient certain preventive protocols, quality metrics, and best practice recommendations. A written personalized care plan for preventive services as well as general preventive health recommendations were provided to patient.     Shela Nevin, [place]   01/05/2019    Medical screening examination/treatment was performed by qualified clinical staff member and as supervising physician I was immediately available for consultation/collaboration. I have reviewed documentation and agree with assessment and plan.  Penni Homans, MD

## 2019-01-05 ENCOUNTER — Encounter: Payer: Self-pay | Admitting: Family Medicine

## 2019-01-05 ENCOUNTER — Ambulatory Visit (INDEPENDENT_AMBULATORY_CARE_PROVIDER_SITE_OTHER): Payer: Medicare Other | Admitting: Family Medicine

## 2019-01-05 ENCOUNTER — Ambulatory Visit (INDEPENDENT_AMBULATORY_CARE_PROVIDER_SITE_OTHER): Payer: Medicare Other | Admitting: *Deleted

## 2019-01-05 ENCOUNTER — Encounter: Payer: Self-pay | Admitting: *Deleted

## 2019-01-05 VITALS — BP 128/82 | HR 72 | Temp 98.6°F | Resp 18 | Ht 63.0 in | Wt 213.2 lb

## 2019-01-05 VITALS — BP 150/90 | HR 72 | Ht 63.0 in | Wt 213.2 lb

## 2019-01-05 DIAGNOSIS — Z800 Family history of malignant neoplasm of digestive organs: Secondary | ICD-10-CM

## 2019-01-05 DIAGNOSIS — E1169 Type 2 diabetes mellitus with other specified complication: Secondary | ICD-10-CM

## 2019-01-05 DIAGNOSIS — Z0001 Encounter for general adult medical examination with abnormal findings: Secondary | ICD-10-CM | POA: Diagnosis not present

## 2019-01-05 DIAGNOSIS — E782 Mixed hyperlipidemia: Secondary | ICD-10-CM

## 2019-01-05 DIAGNOSIS — E6609 Other obesity due to excess calories: Secondary | ICD-10-CM

## 2019-01-05 DIAGNOSIS — R030 Elevated blood-pressure reading, without diagnosis of hypertension: Secondary | ICD-10-CM | POA: Diagnosis not present

## 2019-01-05 DIAGNOSIS — Z0000 Encounter for general adult medical examination without abnormal findings: Secondary | ICD-10-CM

## 2019-01-05 DIAGNOSIS — C189 Malignant neoplasm of colon, unspecified: Secondary | ICD-10-CM | POA: Diagnosis not present

## 2019-01-05 DIAGNOSIS — L989 Disorder of the skin and subcutaneous tissue, unspecified: Secondary | ICD-10-CM

## 2019-01-05 DIAGNOSIS — E669 Obesity, unspecified: Secondary | ICD-10-CM | POA: Diagnosis not present

## 2019-01-05 DIAGNOSIS — E039 Hypothyroidism, unspecified: Secondary | ICD-10-CM

## 2019-01-05 NOTE — Assessment & Plan Note (Addendum)
Patient encouraged to maintain heart healthy diet, regular exercise, adequate sleep. Consider daily probiotics. Take medications as prescribed. Given and reviewed copy of ACP documents from [employer] and encouraged to complete and return. Check labs today, reviewed old labs

## 2019-01-05 NOTE — Patient Instructions (Signed)
Preventive Care 68 Years and Older, Female Preventive care refers to lifestyle choices and visits with your health care provider that can promote health and wellness. What does preventive care include?  A yearly physical exam. This is also called an annual well check.  Dental exams once or twice a year.  Routine eye exams. Ask your health care provider how often you should have your eyes checked.  Personal lifestyle choices, including: ? Daily care of your teeth and gums. ? Regular physical activity. ? Eating a healthy diet. ? Avoiding tobacco and drug use. ? Limiting alcohol use. ? Practicing safe sex. ? Taking low-dose aspirin every day. ? Taking vitamin and mineral supplements as recommended by your health care provider. What happens during an annual well check? The services and screenings done by your health care provider during your annual well check will depend on your age, overall health, lifestyle risk factors, and family history of disease. Counseling Your health care provider may ask you questions about your:  Alcohol use.  Tobacco use.  Drug use.  Emotional well-being.  Home and relationship well-being.  Sexual activity.  Eating habits.  History of falls.  Memory and ability to understand (cognition).  Work and work Statistician.  Reproductive health.  Screening You may have the following tests or measurements:  Height, weight, and BMI.  Blood pressure.  Lipid and cholesterol levels. These may be checked every 5 years, or more frequently if you are over 30 years old.  Skin check.  Lung cancer screening. You may have this screening every year starting at age 68 if you have a 30-pack-year history of smoking and currently smoke or have quit within the past 15 years.  Colorectal cancer screening. All adults should have this screening starting at age 68 and continuing until age 68. You will have tests every 1-10 years, depending on your results and the  type of screening test. People at increased risk should start screening at an earlier age. Screening tests may include: ? Guaiac-based fecal occult blood testing. ? Fecal immunochemical test (FIT). ? Stool DNA test. ? Virtual colonoscopy. ? Sigmoidoscopy. During this test, a flexible tube with a tiny camera (sigmoidoscope) is used to examine your rectum and lower colon. The sigmoidoscope is inserted through your anus into your rectum and lower colon. ? Colonoscopy. During this test, a long, thin, flexible tube with a tiny camera (colonoscope) is used to examine your entire colon and rectum.  Hepatitis C blood test.  Hepatitis B blood test.  Sexually transmitted disease (STD) testing.  Diabetes screening. This is done by checking your blood sugar (glucose) after you have not eaten for a while (fasting). You may have this done every 1-3 years.  Bone density scan. This is done to screen for osteoporosis. You may have this done starting at age 68.  Mammogram. This may be done every 1-2 years. Talk to your health care provider about how often you should have regular mammograms. Talk with your health care provider about your test results, treatment options, and if necessary, the need for more tests. Vaccines Your health care provider may recommend certain vaccines, such as:  Influenza vaccine. This is recommended every year.  Tetanus, diphtheria, and acellular pertussis (Tdap, Td) vaccine. You may need a Td booster every 10 years.  Varicella vaccine. You may need this if you have not been vaccinated.  Zoster vaccine. You may need this after age 68.  Measles, mumps, and rubella (MMR) vaccine. You may need at least  one dose of MMR if you were born in 1957 or later. You may also need a second dose.  Pneumococcal 13-valent conjugate (PCV13) vaccine. One dose is recommended after age 24.  Pneumococcal polysaccharide (PPSV23) vaccine. One dose is recommended after age 24.  Meningococcal  vaccine. You may need this if you have certain conditions.  Hepatitis A vaccine. You may need this if you have certain conditions or if you travel or work in places where you may be exposed to hepatitis A.  Hepatitis B vaccine. You may need this if you have certain conditions or if you travel or work in places where you may be exposed to hepatitis B.  Haemophilus influenzae type b (Hib) vaccine. You may need this if you have certain conditions. Talk to your health care provider about which screenings and vaccines you need and how often you need them. This information is not intended to replace advice given to you by your health care provider. Make sure you discuss any questions you have with your health care provider. Document Released: 01/13/2016 Document Revised: 02/06/2018 Document Reviewed: 10/18/2015 Elsevier Interactive Patient Education  2019 [employer].

## 2019-01-05 NOTE — Assessment & Plan Note (Signed)
On Levothyroxine, continue to monitor 

## 2019-01-05 NOTE — Assessment & Plan Note (Signed)
Encouraged heart healthy diet, increase exercise, avoid trans fats, consider a krill oil cap daily. Not taking her Lipitor

## 2019-01-05 NOTE — Assessment & Plan Note (Addendum)
Follows with Dr Juanita Craver, no symptoms but is overdue for surveillance colonoscopy. She agrees to proceed

## 2019-01-05 NOTE — Assessment & Plan Note (Signed)
Encouraged DASH diet, decrease po intake and increase exercise as tolerated. Needs 7-8 hours of sleep nightly. Avoid trans fats, eat small, frequent meals every 4-5 hours with lean proteins, complex carbs and healthy fats. Minimize simple carbs 

## 2019-01-05 NOTE — Patient Instructions (Signed)
Please schedule your next medicare wellness visit with me in 1 yr.  Continue to eat heart healthy diet (full of fruits, vegetables, whole grains, lean protein, water--limit salt, fat, and sugar intake) and increase physical activity as tolerated.  Continue doing brain stimulating activities (puzzles, reading, adult coloring books, staying active) to keep memory sharp.    Jasmine Morse , Thank you for taking time to come for your Medicare Wellness Visit. I appreciate your ongoing commitment to your health goals. Please review the following plan we discussed and let me know if I can assist you in the future.   These are the goals we discussed: Goals    . Increase physical activity       This is a list of the screening recommended for you and due dates:  Health Maintenance  Topic Date Due  . Complete foot exam   01/15/2017  . Eye exam for diabetics  01/05/2019*  . Flu Shot  03/31/2019*  . Mammogram  01/06/2020*  . DEXA scan (bone density measurement)  01/06/2020*  . Pneumonia vaccines (1 of 2 - PCV13) 01/06/2020*  . Hemoglobin A1C  03/05/2019  . Colon Cancer Screening  04/02/2022  . Tetanus Vaccine  01/15/2026  .  Hepatitis C: One time screening is recommended by Center for Disease Control  (CDC) for  adults born from [birth year] through 1965.   Addressed  *Topic was postponed. The date shown is not the original due date.    Health Maintenance After Age 24 After age 60, you are at a higher risk for certain long-term diseases and infections as well as injuries from falls. Falls are a major cause of broken bones and head injuries in people who are older than age 48. Getting regular preventive care can help to keep you healthy and well. Preventive care includes getting regular testing and making lifestyle changes as recommended by your health care provider. Talk with your health care provider about:  Which screenings and tests you should have. A screening is a test that checks for a disease when  you have no symptoms.  A diet and exercise plan that is right for you. What should I know about screenings and tests to prevent falls? Screening and testing are the best ways to find a health problem early. Early diagnosis and treatment give you the best chance of managing medical conditions that are common after age 2. Certain conditions and lifestyle choices may make you more likely to have a fall. Your health care provider may recommend:  Regular vision checks. Poor vision and conditions such as cataracts can make you more likely to have a fall. If you wear glasses, make sure to get your prescription updated if your vision changes.  Medicine review. Work with your health care provider to regularly review all of the medicines you are taking, including over-the-counter medicines. Ask your health care provider about any side effects that may make you more likely to have a fall. Tell your health care provider if any medicines that you take make you feel dizzy or sleepy.  Osteoporosis screening. Osteoporosis is a condition that causes the bones to get weaker. This can make the bones weak and cause them to break more easily.  Blood pressure screening. Blood pressure changes and medicines to control blood pressure can make you feel dizzy.  Strength and balance checks. Your health care provider may recommend certain tests to check your strength and balance while standing, walking, or changing positions.  Foot health exam. Foot  pain and numbness, as well as not wearing proper footwear, can make you more likely to have a fall.  Depression screening. You may be more likely to have a fall if you have a fear of falling, feel emotionally low, or feel unable to do activities that you used to do.  Alcohol use screening. Using too much alcohol can affect your balance and may make you more likely to have a fall. What actions can I take to lower my risk of falls? General instructions  Talk with your health  care provider about your risks for falling. Tell your health care provider if: ? You fall. Be sure to tell your health care provider about all falls, even ones that seem minor. ? You feel dizzy, sleepy, or off-balance.  Take over-the-counter and prescription medicines only as told by your health care provider. These include any supplements.  Eat a healthy diet and maintain a healthy weight. A healthy diet includes low-fat dairy products, low-fat (lean) meats, and fiber from whole grains, beans, and lots of fruits and vegetables. Home safety  Remove any tripping hazards, such as rugs, cords, and clutter.  Install safety equipment such as grab bars in bathrooms and safety rails on stairs.  Keep rooms and walkways well-lit. Activity   Follow a regular exercise program to stay fit. This will help you maintain your balance. Ask your health care provider what types of exercise are appropriate for you.  If you need a cane or walker, use it as recommended by your health care provider.  Wear supportive shoes that have nonskid soles. Lifestyle  Do not drink alcohol if your health care provider tells you not to drink.  If you drink alcohol, limit how much you have: ? 0-1 drink a day for women. ? 0-2 drinks a day for men.  Be aware of how much alcohol is in your drink. In the [place] one drink equals one typical bottle of beer (12 oz), one-half glass of wine (5 oz), or one shot of hard liquor (1 oz).  Do not use any products that contain nicotine or tobacco, such as cigarettes and e-cigarettes. If you need help quitting, ask your health care provider. Summary  Having a healthy lifestyle and getting preventive care can help to protect your health and wellness after age 63.  Screening and testing are the best way to find a health problem early and help you avoid having a fall. Early diagnosis and treatment give you the best chance for managing medical conditions that are more common for people  who are older than age 77.  Falls are a major cause of broken bones and head injuries in people who are older than age 60. Take precautions to prevent a fall at home.  Work with your health care provider to learn what changes you can make to improve your health and wellness and to prevent falls. This information is not intended to replace advice given to you by your health care provider. Make sure you discuss any questions you have with your health care provider. Document Released: 10/30/2017 Document Revised: 10/30/2017 Document Reviewed: 10/30/2017 Elsevier Interactive Patient Education  2019 [employer].

## 2019-01-05 NOTE — Progress Notes (Signed)
Subjective:    Patient ID: Jasmine Morse, female    DOB: June 15, 1951, 68 y.o.   MRN: [id number]  No chief complaint on file.   HPI Patient is in today for annual preventative exam and follow up for diabetes, hyperlipidemia and obesity. She feels well and denies any recent febrile illness or hospitalizations. Her younger sister is currently hospitalized with kidney failure. Is doing well with activities of daily living. No polyuria or polydipsia. She is trying to stay active is heading to a trip to [place] this spring. Is trying to maintain a heart healthy diet. Denies CP/palp/SOB/HA/congestion/fevers/GI or GU c/o. Taking meds as prescribed. Not taking Glipizide and Lipitor. Takes other meds as prescribed. Has not been checking sugars regularly due to glucometer. Drinks plenty of water each day.   Past Medical History:  Diagnosis Date  . Allergic state 08/10/2014  . Cancer ([place])    colon cancer stage III (T3, N2b) s/p right hemicolectomy, FOLFOX-- Dr Learta Codding  . Diabetes mellitus    type II  . Hair loss 07/10/2015  . Obesity, unspecified 08/10/2014  . Preventative health care 09/09/2017  . Thyroid disease    hypothyroidism    Past Surgical History:  Procedure Laterality Date  . CESAREAN SECTION     x 3  . INNER EAR SURGERY     due to numerous ear infections  . TONSILLECTOMY      Family History  Problem Relation Age of Onset  . Hypertension Son   . Cancer Father        colon  . Cancer Brother        colon  . Diabetes Other   . Hypertension Other   . Stroke Other     Social History   Socioeconomic History  . Marital status: Widowed    Spouse name: Not on file  . Number of children: 3  . Years of education: Not on file  . Highest education level: Not on file  Occupational History  . Occupation: [employer]    Employer: [employer]  Social Needs  . Financial resource strain: Not on file  . Food insecurity:    Worry: Not on file    Inability: Not on file  .  Transportation needs:    Medical: Not on file    Non-medical: Not on file  Tobacco Use  . Smoking status: Never Smoker  . Smokeless tobacco: Never Used  Substance and Sexual Activity  . Alcohol use: No  . Drug use: No  . Sexual activity: Yes  Lifestyle  . Physical activity:    Days per week: Not on file    Minutes per session: Not on file  . Stress: Not on file  Relationships  . Social connections:    Talks on phone: Not on file    Gets together: Not on file    Attends religious service: Not on file    Active member of club or organization: Not on file    Attends meetings of clubs or organizations: Not on file    Relationship status: Not on file  . Intimate partner violence:    Fear of current or ex partner: Not on file    Emotionally abused: Not on file    Physically abused: Not on file    Forced sexual activity: Not on file  Other Topics Concern  . Not on file  Social History Narrative  . Not on file    Outpatient Medications Prior to Visit  Medication Sig Dispense Refill  . atorvastatin (LIPITOR) 10 MG tablet Take 1 tablet (10 mg total) by mouth daily. (Patient not taking: Reported on 01/05/2019) 90 tablet 3  . EZ SMART BLOOD GLUCOSE LANCETS MISC 1 strip by Does not apply route daily. (Patient not taking: Reported on 01/05/2019) 100 each 12  . glipiZIDE (GLUCOTROL) 5 MG tablet Take 1 tablet (5 mg total) by mouth daily. 90 tablet 1  . glucose blood test strip Use as instructed (Patient not taking: Reported on 01/05/2019) 100 each 12  . levothyroxine (SYNTHROID, LEVOTHROID) 88 MCG tablet TAKE 1 TABLET BY MOUTH ONCE DAILY BEFOREBREAKFAST 90 tablet 1  . lisinopril (PRINIVIL,ZESTRIL) 5 MG tablet Take 1 tablet (5 mg total) by mouth daily. 90 tablet 3  . metFORMIN (GLUCOPHAGE) 1000 MG tablet Take 1 tablet (1,000 mg total) by mouth 2 (two) times daily with a meal. 180 tablet 1   No facility-administered medications prior to visit.     Allergies  Allergen Reactions  . Codeine      REACTION: Nausea \\T \ Diarrhea    Review of Systems  Constitutional: Negative for fever and malaise/fatigue.  HENT: Negative for congestion.   Eyes: Negative for blurred vision.  Respiratory: Negative for shortness of breath.   Cardiovascular: Negative for chest pain, palpitations and leg swelling.  Gastrointestinal: Negative for abdominal pain, blood in stool and nausea.  Genitourinary: Negative for dysuria and frequency.  Musculoskeletal: Negative for falls.  Skin: Negative for rash.  Neurological: Negative for dizziness, loss of consciousness and headaches.  Endo/Heme/Allergies: Negative for environmental allergies.  Psychiatric/Behavioral: Negative for depression. The patient is not nervous/anxious.        Objective:    Physical Exam Constitutional:      General: She is not in acute distress.    Appearance: She is well-developed.  HENT:     Head: Normocephalic and atraumatic.  Eyes:     Conjunctiva/sclera: Conjunctivae normal.  Neck:     Musculoskeletal: Neck supple.     Thyroid: No thyromegaly.  Cardiovascular:     Rate and Rhythm: Normal rate and regular rhythm.     Heart sounds: Normal heart sounds. No murmur.  Pulmonary:     Effort: Pulmonary effort is normal. No respiratory distress.     Breath sounds: Normal breath sounds.  Abdominal:     General: Bowel sounds are normal. There is no distension.     Palpations: Abdomen is soft. There is no mass.     Tenderness: There is no abdominal tenderness.  Lymphadenopathy:     Cervical: No cervical adenopathy.  Skin:    General: Skin is warm and dry.  Neurological:     Mental Status: She is alert and oriented to person, place, and time.  Psychiatric:        Behavior: Behavior normal.     BP 128/82   Pulse 72   Temp 98.6 F (37 C) (Oral)   Resp 18   Ht 5\' 3"  (1.6 m)   Wt 213 lb 3.2 oz (96.7 kg)   SpO2 97%   BMI 37.77 kg/m  Wt Readings from Last 3 Encounters:  01/05/19 213 lb 3.2 oz (96.7 kg)  01/05/19  213 lb 3.2 oz (96.7 kg)  09/04/18 214 lb 12.8 oz (97.4 kg)     Lab Results  Component Value Date   WBC 5.9 01/05/2019   HGB 14.0 01/05/2019   HCT 41.4 01/05/2019   PLT 252.0 01/05/2019   GLUCOSE 124 (H) 01/05/2019  CHOL 266 (H) 01/05/2019   TRIG 281.0 (H) 01/05/2019   HDL 61.90 01/05/2019   LDLDIRECT 176.0 01/05/2019   LDLCALC NOT CALC 06/24/2015   ALT 45 (H) 01/05/2019   AST 49 (H) 01/05/2019   NA 138 01/05/2019   K 4.5 01/05/2019   CL 99 01/05/2019   CREATININE 0.74 01/05/2019   BUN 13 01/05/2019   CO2 29 01/05/2019   TSH 2.12 01/05/2019   HGBA1C 8.7 (H) 01/05/2019   MICROALBUR <0.7 02/06/2018    Lab Results  Component Value Date   TSH 2.12 01/05/2019   Lab Results  Component Value Date   WBC 5.9 01/05/2019   HGB 14.0 01/05/2019   HCT 41.4 01/05/2019   MCV 91.7 01/05/2019   PLT 252.0 01/05/2019   Lab Results  Component Value Date   NA 138 01/05/2019   K 4.5 01/05/2019   CO2 29 01/05/2019   GLUCOSE 124 (H) 01/05/2019   BUN 13 01/05/2019   CREATININE 0.74 01/05/2019   BILITOT 0.5 01/05/2019   ALKPHOS 60 01/05/2019   AST 49 (H) 01/05/2019   ALT 45 (H) 01/05/2019   PROT 7.3 01/05/2019   ALBUMIN 4.5 01/05/2019   CALCIUM 9.8 01/05/2019   GFR 83.08 01/05/2019   Lab Results  Component Value Date   CHOL 266 (H) 01/05/2019   Lab Results  Component Value Date   HDL 61.90 01/05/2019   Lab Results  Component Value Date   LDLCALC NOT CALC 06/24/2015   Lab Results  Component Value Date   TRIG 281.0 (H) 01/05/2019   Lab Results  Component Value Date   CHOLHDL 4 01/05/2019   Lab Results  Component Value Date   HGBA1C 8.7 (H) 01/05/2019       Assessment & Plan:   Problem List Items Addressed This Visit    Malignant neoplasm of colon ([place])    Follows with Dr Juanita Craver, no symptoms but is overdue for surveillance colonoscopy. She agrees to proceed      Hypothyroidism    On Levothyroxine, continue to monitor      Diabetes mellitus type 2  in obese ([facility])    hgba1c acceptable, minimize simple carbs. Increase exercise as tolerated. Continue current meds      Relevant Orders   Hemoglobin A1c (Completed)   Comprehensive metabolic panel (Completed)   Obesity    Encouraged DASH diet, decrease po intake and increase exercise as tolerated. Needs 7-8 hours of sleep nightly. Avoid trans fats, eat small, frequent meals every 4-5 hours with lean proteins, complex carbs and healthy fats. Minimize simple carbs      Hyperlipidemia    Encouraged heart healthy diet, increase exercise, avoid trans fats, consider a krill oil cap daily. Not taking her Lipitor      Relevant Orders   Lipid panel (Completed)   Preventative health care    Patient encouraged to maintain heart healthy diet, regular exercise, adequate sleep. Consider daily probiotics. Take medications as prescribed. Given and reviewed copy of ACP documents from [employer] and encouraged to complete and return. Check labs today, reviewed old labs      Relevant Orders   CBC (Completed)   TSH (Completed)    Other Visit Diagnoses    Elevated blood pressure reading    -  Primary   Relevant Orders   CBC (Completed)   TSH (Completed)   Family history of colon cancer       Relevant Orders   Ambulatory referral to Gastroenterology  Skin lesion of face       Relevant Orders   Ambulatory referral to Dermatology      I am having Jasmine Morse maintain her glipiZIDE, glucose blood, EZ SMART BLOOD GLUCOSE LANCETS, levothyroxine, lisinopril, atorvastatin, and metFORMIN.  No orders of the defined types were placed in this encounter.    Penni Homans, MD

## 2019-01-05 NOTE — Assessment & Plan Note (Signed)
hgba1c acceptable, minimize simple carbs. Increase exercise as tolerated. Continue current meds 

## 2019-01-06 LAB — COMPREHENSIVE METABOLIC PANEL
ALK PHOS: 60 U/L (ref 39–117)
ALT: 45 U/L — ABNORMAL HIGH (ref 0–35)
AST: 49 U/L — AB (ref 0–37)
Albumin: 4.5 g/dL (ref 3.5–5.2)
BILIRUBIN TOTAL: 0.5 mg/dL (ref 0.2–1.2)
BUN: 13 mg/dL (ref 6–23)
CO2: 29 mEq/L (ref 19–32)
CREATININE: 0.74 mg/dL (ref 0.40–1.20)
Calcium: 9.8 mg/dL (ref 8.4–10.5)
Chloride: 99 mEq/L (ref 96–112)
GFR: 83.08 mL/min (ref >60.00)
Glucose, Bld: 124 mg/dL — ABNORMAL HIGH (ref 70–99)
Potassium: 4.5 mEq/L (ref 3.5–5.1)
SODIUM: 138 meq/L (ref 135–145)
TOTAL PROTEIN: 7.3 g/dL (ref 6.0–8.3)

## 2019-01-06 LAB — LIPID PANEL
Cholesterol: 266 mg/dL — ABNORMAL HIGH (ref 0–200)
HDL: 61.9 mg/dL (ref >39.00)
NonHDL: 204.34
Total CHOL/HDL Ratio: 4
Triglycerides: 281 mg/dL — ABNORMAL HIGH (ref 0.0–149.0)
VLDL: 56.2 mg/dL — ABNORMAL HIGH (ref 0.0–40.0)

## 2019-01-06 LAB — TSH: TSH: 2.12 u[IU]/mL (ref 0.35–4.50)

## 2019-01-06 LAB — CBC
HCT: 41.4 % (ref 36.0–46.0)
Hemoglobin: 14 g/dL (ref 12.0–15.0)
MCHC: 33.7 g/dL (ref 30.0–36.0)
MCV: 91.7 fl (ref 78.0–100.0)
Platelets: 252 10*3/uL (ref 150.0–400.0)
RBC: 4.52 Mil/uL (ref 3.87–5.11)
RDW: 13.4 % (ref 11.5–15.5)
WBC: 5.9 10*3/uL (ref 4.0–10.5)

## 2019-01-06 LAB — LDL CHOLESTEROL, DIRECT: Direct LDL: 176 mg/dL

## 2019-01-06 LAB — HEMOGLOBIN A1C: Hgb A1c MFr Bld: 8.7 % — ABNORMAL HIGH (ref 4.6–6.5)

## 2019-01-29 ENCOUNTER — Other Ambulatory Visit: Payer: Self-pay | Admitting: Family Medicine

## 2019-01-29 DIAGNOSIS — E039 Hypothyroidism, unspecified: Secondary | ICD-10-CM

## 2019-01-29 DIAGNOSIS — T7840XA Allergy, unspecified, initial encounter: Secondary | ICD-10-CM

## 2019-01-29 DIAGNOSIS — E038 Other specified hypothyroidism: Secondary | ICD-10-CM

## 2019-01-29 DIAGNOSIS — E785 Hyperlipidemia, unspecified: Secondary | ICD-10-CM

## 2019-02-11 DIAGNOSIS — R519 Headache, unspecified: Secondary | ICD-10-CM | POA: Insufficient documentation

## 2019-03-02 ENCOUNTER — Emergency Department (HOSPITAL_COMMUNITY): Payer: Medicare Other

## 2019-03-02 ENCOUNTER — Other Ambulatory Visit: Payer: Self-pay

## 2019-03-02 ENCOUNTER — Emergency Department (HOSPITAL_COMMUNITY)
Admission: EM | Admit: 2019-03-02 | Discharge: 2019-03-02 | Disposition: A | Discharge status: Home / Self Care | Payer: Medicare Other | Attending: Emergency Medicine | Admitting: Emergency Medicine

## 2019-03-02 ENCOUNTER — Encounter (HOSPITAL_COMMUNITY): Payer: Self-pay | Admitting: Emergency Medicine

## 2019-03-02 DIAGNOSIS — E119 Type 2 diabetes mellitus without complications: Secondary | ICD-10-CM | POA: Diagnosis not present

## 2019-03-02 DIAGNOSIS — Z7984 Long term (current) use of oral hypoglycemic drugs: Secondary | ICD-10-CM | POA: Insufficient documentation

## 2019-03-02 DIAGNOSIS — E039 Hypothyroidism, unspecified: Secondary | ICD-10-CM | POA: Diagnosis not present

## 2019-03-02 DIAGNOSIS — K5900 Constipation, unspecified: Secondary | ICD-10-CM

## 2019-03-02 DIAGNOSIS — R109 Unspecified abdominal pain: Secondary | ICD-10-CM | POA: Diagnosis present

## 2019-03-02 DIAGNOSIS — Z79899 Other long term (current) drug therapy: Secondary | ICD-10-CM | POA: Insufficient documentation

## 2019-03-02 LAB — CBC WITH DIFFERENTIAL/PLATELET
Abs Immature Granulocytes: 0.01 10*3/uL (ref 0.00–0.07)
Basophils Absolute: 0 10*3/uL (ref 0.0–0.1)
Basophils Relative: 0 %
EOS ABS: 0 10*3/uL (ref 0.0–0.5)
EOS PCT: 0 %
HCT: 38.5 % (ref 36.0–46.0)
Hemoglobin: 12.9 g/dL (ref 12.0–15.0)
Immature Granulocytes: 0 %
Lymphocytes Relative: 15 %
Lymphs Abs: 1.1 10*3/uL (ref 0.7–4.0)
MCH: 30.6 pg (ref 26.0–34.0)
MCHC: 33.5 g/dL (ref 30.0–36.0)
MCV: 91.4 fL (ref 80.0–100.0)
Monocytes Absolute: 0.5 10*3/uL (ref 0.1–1.0)
Monocytes Relative: 8 %
Neutro Abs: 5.3 10*3/uL (ref 1.7–7.7)
Neutrophils Relative %: 77 %
Platelets: 250 10*3/uL (ref 150–400)
RBC: 4.21 MIL/uL (ref 3.87–5.11)
RDW: 12.4 % (ref 11.5–15.5)
WBC: 7 10*3/uL (ref 4.0–10.5)
nRBC: 0 % (ref 0.0–0.2)

## 2019-03-02 LAB — COMPREHENSIVE METABOLIC PANEL
ALT: 29 U/L (ref 0–44)
AST: 30 U/L (ref 15–41)
Albumin: 3.8 g/dL (ref 3.5–5.0)
Alkaline Phosphatase: 60 U/L (ref 38–126)
Anion gap: 9 (ref 5–15)
BUN: 10 mg/dL (ref 8–23)
CHLORIDE: 100 mmol/L (ref 98–111)
CO2: 28 mmol/L (ref 22–32)
Calcium: 9.3 mg/dL (ref 8.9–10.3)
Creatinine, Ser: 0.84 mg/dL (ref 0.44–1.00)
GFR calc Af Amer: >60 mL/min (ref >60)
GFR calc non Af Amer: >60 mL/min (ref >60)
GLUCOSE: 232 mg/dL — AB (ref 70–99)
Potassium: 3.9 mmol/L (ref 3.5–5.1)
Sodium: 137 mmol/L (ref 135–145)
Total Bilirubin: 0.7 mg/dL (ref 0.3–1.2)
Total Protein: 7 g/dL (ref 6.5–8.1)

## 2019-03-02 LAB — LIPASE, BLOOD: Lipase: 27 U/L (ref 11–51)

## 2019-03-02 MED ORDER — MILK AND MOLASSES ENEMA: 1.0000 | Freq: Once | RECTAL | Status: DC

## 2019-03-02 MED ORDER — MORPHINE SULFATE (PF) 4 MG/ML IV SOLN: 4.0000 mg | Freq: Once | INTRAVENOUS | Status: DC

## 2019-03-02 MED ORDER — SODIUM CHLORIDE 0.9 % IV BOLUS: 500.0000 mL | Freq: Once | INTRAVENOUS | Status: DC

## 2019-03-02 MED ORDER — IOHEXOL 300 MG/ML  SOLN
100.0000 mL | Freq: Once | INTRAMUSCULAR | Status: AC | PRN
  Administered 2019-03-02: 100 mL via INTRAVENOUS

## 2019-03-02 NOTE — ED Notes (Signed)
Discharge instructions disc ussed with patient. Pt given printed instructions and same reviewed. Pt refused wc and ambulates with steady gait to discharge where family awaits.

## 2019-03-02 NOTE — ED Triage Notes (Signed)
Pt stated she has not been able to move her bowels for 3 days. Tried fiber foods and otc meds. with no relief. Hx of constipation.

## 2019-03-02 NOTE — ED Notes (Signed)
Pt ambulates easily back to room. Steady gait.

## 2019-03-02 NOTE — ED Provider Notes (Signed)
Care assumed at shift change from [facility], pending reevaluation for enema.  See her note for full HPI and work-up.  Briefly, patient presenting with 4 days of constipation.  History of hemicolectomy for cancer 10 years prior.  Labs are reassuring.  CT obtained and is negative for bowel obstruction.  Does show evidence of stool burden.  Dental findings of endometrial thickening also discussed with patient and she was recommended to follow-up closely with her GYN.  Reportedly declined rectal exam.  Enema provided.  Plan to reevaluate with anticipated discharge with outpatient follow-up. Physical Exam  BP (!) 123/55   Pulse 70   Temp 97.6 F (36.4 C) (Oral)   Resp 16   Ht 5\' 2"  (1.575 m)   Wt 95.3 kg   SpO2 97%   BMI 38.41 kg/m   Physical Exam Vitals signs and nursing note reviewed.  Constitutional:      Appearance: She is well-developed.  HENT:     Head: Normocephalic and atraumatic.  Eyes:     Conjunctiva/sclera: Conjunctivae normal.  Cardiovascular:     Rate and Rhythm: Normal rate.  Pulmonary:     Effort: Pulmonary effort is normal.  Neurological:     Mental Status: She is alert.  Psychiatric:        Mood and Affect: Mood normal.        Behavior: Behavior normal.     ED Course/Procedures   Clinical Course as of Mar 01 730  Mon Mar 02, 2019  0707 Pt evaluated after enema. Pt was able to pass some stool however does not feel like she has relieved it all. No distress noted. Discussed plan per previous provider of miralax and continue suppositories with close PCP and Gyn follow up. Pt agreeable.   [JR]    Clinical Course User Index [JR] Robinson, [place] N, PA-C    Procedures  MDM  Discussed symptomatic management with patient and stressed PCP follow-up.  Patient is also aware she needs to follow-up with gynecologist for incidental findings on CT scan.  Patient agreeable to plan and safe for discharge.      Robinson, [place] N, PA-C 03/02/19 [place],  April, MD 03/02/19 [phone]

## 2019-03-02 NOTE — ED Notes (Signed)
Patient transported to CT 

## 2019-03-02 NOTE — Discharge Instructions (Signed)
IT IS VERY IMPORTANT THAT YOU FOLLOW UP WITH OB/GYN FOR EVALUATION OF YOUR ENDOMETRIAL WALL THICKENING.  Follow up with your GI doctor for further evaluation.  It is important you stay well hydrated with water. Your urine should be clear to pale yellow.  Take 1-2 capfuls of miralax a day to help with regular bowel movements.  Continue using the suppositories, stool softeners, and home enemas as needed for further constipation.  Return to the ER with any new, worsening or concerning symptoms.

## 2019-03-02 NOTE — ED Notes (Signed)
Pt retuyrns to bathroom with steady gait.

## 2019-03-02 NOTE — ED Provider Notes (Addendum)
[facility] EMERGENCY DEPARTMENT Provider Note   CSN: [id number] Arrival date & time: 03/02/19  0413    History   Chief Complaint Chief Complaint  Patient presents with  . Constipation    HPI Jasmine Morse is a 68 y.o. female presenting for evaluation of constipation.  Patient states for the past 4 days, she has been unable to have a bowel movement.  Her last bowel movement was hard and she needed to strain, though she states this is normal for her.  Patient states she has tried an over-the-counter fiber pill, Dulcolax, and suppositories without improvement of symptoms.  Patient reports pain at her rectum, but denies significant abdominal pain.  She denies fevers, chills, chest pain, shortness breath, nausea, vomiting, urinary symptoms.  Patient reports a history of colon resection status post colon cancer, no other abdominal surgeries.  Patient states her GI doctor is Dr. Collene Mares, but she has not followed up recently.  Patient states that she tries to stay well-hydrated, but does not always do a good job.  She is not on chronic pain medication.  She has been taking all her other medication as prescribed including her Synthroid.  Patient does not check her blood sugars daily, she does not feel like they have been high or low recently.     HPI  Past Medical History:  Diagnosis Date  . Allergic state 08/10/2014  . Cancer ([place])    colon cancer stage III (T3, N2b) s/p right hemicolectomy, FOLFOX-- Dr Learta Codding  . Diabetes mellitus    type II  . Hair loss 07/10/2015  . Obesity, unspecified 08/10/2014  . Preventative health care 09/09/2017  . Thyroid disease    hypothyroidism    Patient Active Problem List   Diagnosis Date Noted  . Preventative health care 09/09/2017  . Hair loss 07/10/2015  . Hyperlipidemia 08/15/2014  . Obesity 08/10/2014  . Allergic state 08/10/2014  . Malignant neoplasm of colon ([place]) 11/01/2009  . Hypothyroidism 11/01/2009  . Diabetes mellitus  type 2 in obese ([place]) 11/01/2009    Past Surgical History:  Procedure Laterality Date  . CESAREAN SECTION     x 3  . INNER EAR SURGERY     due to numerous ear infections  . TONSILLECTOMY       OB History   No obstetric history on file.      Home Medications    Prior to Admission medications   Medication Sig Start Date End Date Taking? Authorizing Provider  atorvastatin (LIPITOR) 10 MG tablet Take 1 tablet (10 mg total) by mouth daily. Patient not taking: Reported on 01/05/2019 09/05/18   Mosie Lukes, MD  EZ SMART BLOOD GLUCOSE LANCETS MISC 1 strip by Does not apply route daily. Patient not taking: Reported on 01/05/2019 02/07/18   Mosie Lukes, MD  glipiZIDE (GLUCOTROL) 5 MG tablet Take 1 tablet (5 mg total) by mouth daily. 02/07/18   Mosie Lukes, MD  glucose blood test strip Use as instructed Patient not taking: Reported on 01/05/2019 02/07/18   Mosie Lukes, MD  levothyroxine (SYNTHROID, LEVOTHROID) 88 MCG tablet TAKE 1 TABLET BY MOUTH ONCE DAILY BEFOREBREAKFAST 01/29/19   Mosie Lukes, MD  lisinopril (PRINIVIL,ZESTRIL) 5 MG tablet Take 1 tablet (5 mg total) by mouth daily. 09/04/18   Mosie Lukes, MD  metFORMIN (GLUCOPHAGE) 1000 MG tablet Take 1 tablet (1,000 mg total) by mouth 2 (two) times daily with a meal. 11/10/18   Mosie Lukes, MD  Family History Family History  Problem Relation Age of Onset  . Hypertension Son   . Cancer Father        colon  . Cancer Brother        colon  . Diabetes Other   . Hypertension Other   . Stroke Other     Social History Social History   Tobacco Use  . Smoking status: Never Smoker  . Smokeless tobacco: Never Used  Substance Use Topics  . Alcohol use: No  . Drug use: No     Allergies   Codeine   Review of Systems Review of Systems  Gastrointestinal: Positive for constipation and rectal pain.  All other systems reviewed and are negative.    Physical Exam Updated Vital Signs BP (!) 123/55   Pulse 70    Temp 97.6 F (36.4 C) (Oral)   Resp 16   Ht 5\' 2"  (1.575 m)   Wt 95.3 kg   SpO2 97%   BMI 38.41 kg/m   Physical Exam Vitals signs and nursing note reviewed.  Constitutional:      General: She is not in acute distress.    Appearance: She is well-developed.     Comments: Appears nontoxic  HENT:     Head: Normocephalic and atraumatic.  Eyes:     Conjunctiva/sclera: Conjunctivae normal.     Pupils: Pupils are equal, round, and reactive to light.  Neck:     Musculoskeletal: Normal range of motion and neck supple.  Cardiovascular:     Rate and Rhythm: Normal rate and regular rhythm.     Pulses: Normal pulses.  Pulmonary:     Effort: Pulmonary effort is normal. No respiratory distress.     Breath sounds: Normal breath sounds. No wheezing.  Abdominal:     General: There is no distension.     Palpations: Abdomen is soft. There is no mass.     Tenderness: There is abdominal tenderness. There is no guarding or rebound.     Comments: Mild tenderness palpation of epigastric abdomen.  Otherwise soft without rigidity, guarding, distention.  Negative rebound. Central surgical scar noted  Musculoskeletal: Normal range of motion.  Skin:    General: Skin is warm and dry.     Capillary Refill: Capillary refill takes less than 2 seconds.  Neurological:     Mental Status: She is alert and oriented to person, place, and time.      ED Treatments / Results  Labs (all labs ordered are listed, but only abnormal results are displayed) Labs Reviewed  COMPREHENSIVE METABOLIC PANEL - Abnormal; Notable for the following components:      Result Value   Glucose, Bld 232 (*)    All other components within normal limits  CBC WITH DIFFERENTIAL/PLATELET  LIPASE, BLOOD    EKG None  Radiology No results found.  Procedures Procedures (including critical care time)  Medications Ordered in ED Medications  sodium chloride 0.9 % bolus 500 mL (500 mLs Intravenous Not Given 03/02/19 0520)  morphine  4 MG/ML injection 4 mg (4 mg Intravenous Refused 03/02/19 0519)  milk and molasses enema (has no administration in time range)     Initial Impression / Assessment and Plan / ED Course  I have reviewed the triage vital signs and the nursing notes.  Pertinent labs & imaging results that were available during my care of the patient were reviewed by me and considered in my medical decision making (see chart for details).  Pt presenting for evaluation of constipation.  Physical exam reassuring in that patient does not have significant abdominal tenderness.  However, she is reporting rectal pain, and history is concerning for a partial colectomy.  As such, higher concern for obstruction.  Will obtain labs and CT abdomen pelvis for further evaluation.  Patient requesting an enema, will give milk and molasses enema to attempt sx control.   Abs reassuring, no leukocytosis.  Kidney, liver, pancreatic function reassuring.  CT pending. Electrolytes stable. Glucose mildly elevated at 232, no sign of DKA or HHS. Pt refusing fluids at this time.  CT shows endometrial wall thickening, which is concerning in the presence of a history of colon cancer.  No obstruction noted on exam.  Significant stool burden.  Additionally, CT shows cholelithiasis, however symptoms are not consistent with cholelithiasis.  Patient pending enema.  Encourage patient to follow-up with OB/GYN, GI.  Courage improved hydration and MiraLAX use for daily bowel movement.  At this time, patient appears safe for discharge.  Return precautions given.  Patient states she understands and agrees plan.   Final Clinical Impressions(s) / ED Diagnoses   Final diagnoses:  None    ED Discharge Orders    None       Franchot Heidelberg, PA-C 03/02/19 2353    [place], April, MD 03/02/19 0618    Franchot Heidelberg, PA-C 03/02/19 [place], April, MD 03/09/19 2354

## 2019-03-06 ENCOUNTER — Encounter: Payer: Self-pay | Admitting: Family Medicine

## 2019-04-13 ENCOUNTER — Other Ambulatory Visit: Payer: Self-pay

## 2019-04-13 ENCOUNTER — Ambulatory Visit (INDEPENDENT_AMBULATORY_CARE_PROVIDER_SITE_OTHER): Payer: Medicare Other | Admitting: Medical

## 2019-04-13 ENCOUNTER — Encounter: Payer: Self-pay | Admitting: Medical

## 2019-04-13 ENCOUNTER — Other Ambulatory Visit: Payer: Self-pay | Admitting: Family Medicine

## 2019-04-13 DIAGNOSIS — T7840XA Allergy, unspecified, initial encounter: Secondary | ICD-10-CM

## 2019-04-13 MED ORDER — HYDROXYZINE HCL 10 MG PO TABS: ORAL_TABLET | ORAL | 0 refills | Status: DC

## 2019-04-13 MED ORDER — INSULIN ASPART 100 UNIT/ML ~~LOC~~ SOLN: 5.0000 [IU] | Freq: Three times a day (TID) | SUBCUTANEOUS | 99 refills | Status: DC

## 2019-04-13 MED ORDER — ACCU-CHEK GUIDE W/DEVICE KIT: 1.0000 | PACK | 0 refills | Status: DC

## 2019-04-13 MED ORDER — PREDNISONE 10 MG (21) PO TBPK: ORAL_TABLET | ORAL | 0 refills | Status: DC

## 2019-04-13 NOTE — Patient Instructions (Addendum)
Allergic reaction that occurred after working in the yard.  Possible cause may have been sumac.  Do think you would benefit from tapered prednisone and hydroxyzine antihistamine for the itching.  Rx advisement given regarding both medication.  Explained that prednisone might increase her sugar levels and hydroxyzine has sedating effect.  Expect rash will gradually improve but patient might need steroid njection later in the week if not improving.  Also asked her to watch the area on left thigh to make sure she is not exhibiting signs/symptoms of skin infection which can occur sometimes secondary to itching.  I explained to patient that sending in NovoLog pen for mealtime insulin use if her pre-meal sugars are over 200.  If sugars over 200 then she will fall the below sliding scale of  201 to 250 5 units 251-300  8 units 301 to 350 10 units 351-400 12 units  If sugars over 400 give [place] a call.  I called patient's pharmacy and explained the sliding scale and asked him to place it on the prescription.  Too many characters so prescription initially printed rather than sent electronically.  Asked patient to give [place] a call this upcoming Friday for update.  Also explained in detail that patient extremely important for her to follow her sugars while she is on the prednisone.  She states that she will need to buy a glucometer for this as she lost her old one.

## 2019-04-13 NOTE — Progress Notes (Signed)
Subjective:    Patient ID: Jasmine Morse, female    DOB: [birth year], 68 y.o.   MRN: [id number]  HPI Virtual Visit via Video Note  I connected with Tabathia Knoche Lac on 04/13/19 at  3:20 PM EDT by a video enabled telemedicine application and verified that I am speaking with the correct person using two identifiers.   I discussed the limitations of evaluation and management by telemedicine and the availability of in person appointments. The patient expressed understanding and agreed to proceed.  History of Present Illness:  Pt has rash 3-4 days on elbow crease on both elbow creases up her bicep and some on tricep area as well. Some describes pinkish/red rash. Pt has been working in yard recently and thinks came into contact with sumac.  She thinks this rash represents allergic reaction.  The areas do itch moderate to severely.  The area on left thigh is slight warm to touch. No pain on palpation.   Observations/Objective: No acute distress. On inspection of the area by video appears to have slight rash to both antecubital fossa is with papular type appearance going up both bicep areas with some papular rash into the tricep area.  On her left thigh she appears to have moderate pinkish-red rash about 4 cm in diameter per patient report.  She states that the area feels slightly warm to touch but not hot.  On palpation she states the area is not tender.  Assessment and Plan: Allergic reaction that occurred after working in the yard.  Possible cause may have been sumac.  Do think you would benefit from tapered prednisone and hydroxyzine antihistamine for the itching.  Rx advisement given regarding both medication.  Explained that prednisone might increase her sugar levels and hydroxyzine has sedating effect.  Expect rash will gradually improve but patient might need steroid njection later in the week if not improving.  Also asked her to watch the area on left thigh to make sure she is not exhibiting  signs/symptoms of skin infection which can occur sometimes secondary to itching.  I explained to patient that sending in NovoLog pen for mealtime insulin use if her pre-meal sugars are over 200.  If sugars over 200 then she will fall the below sliding scale of  201 to 250 5 units 251-300  8 units 301 to 350 10 units 351-400 12 units  If sugars over 400 give [place] a call.  I called patient's pharmacy and explained the sliding scale and asked him to place it on the prescription.  Too many characters so prescription initially printed rather than sent electronically.  Asked patient to give [place] a call this upcoming Friday for update.  Also explained in detail that patient extremely important for her to follow her sugars while she is on the prednisone.  She states that she will need to buy a glucometer for this as she lost her old one.   Follow Up Instructions:    I discussed the assessment and treatment plan with the patient. The patient was provided an opportunity to ask questions and all were answered. The patient agreed with the plan and demonstrated an understanding of the instructions.   The patient was advised to call back or seek an in-person evaluation if the symptoms worsen or if the condition fails to improve as anticipated.  I provided 25 minutes of non-face-to-face time during this encounter.   Mackie Pai, PA-C    Review of Systems  Constitutional: Negative for chills, fatigue  and fever.  Respiratory: Negative for cough and choking.   Cardiovascular: Negative for chest pain and palpitations.  Skin: Positive for rash.       With itching.  Neurological: Negative for dizziness and headaches.  Hematological: Negative for adenopathy. Does not bruise/bleed easily.  Psychiatric/Behavioral: Negative for behavioral problems and confusion.       Objective:   Physical Exam See objective portion       Assessment & Plan:

## 2019-07-14 ENCOUNTER — Ambulatory Visit: Payer: Medicare Other | Admitting: Family Medicine

## 2019-07-16 ENCOUNTER — Ambulatory Visit: Payer: Medicare Other | Admitting: Family Medicine

## 2019-07-17 ENCOUNTER — Other Ambulatory Visit: Payer: Self-pay

## 2019-07-17 MED ORDER — GLUCOSE BLOOD VI STRP: ORAL_STRIP | 12 refills | Status: AC

## 2019-08-04 ENCOUNTER — Other Ambulatory Visit: Payer: Self-pay

## 2019-08-04 DIAGNOSIS — E785 Hyperlipidemia, unspecified: Secondary | ICD-10-CM

## 2019-08-04 DIAGNOSIS — E038 Other specified hypothyroidism: Secondary | ICD-10-CM

## 2019-08-04 DIAGNOSIS — T7840XA Allergy, unspecified, initial encounter: Secondary | ICD-10-CM

## 2019-08-04 DIAGNOSIS — E039 Hypothyroidism, unspecified: Secondary | ICD-10-CM

## 2019-08-04 MED ORDER — LEVOTHYROXINE SODIUM 88 MCG PO TABS: 88.0000 ug | ORAL_TABLET | Freq: Every day | ORAL | 1 refills | Status: DC

## 2019-10-22 ENCOUNTER — Other Ambulatory Visit: Payer: Self-pay | Admitting: Family Medicine

## 2019-10-22 DIAGNOSIS — T7840XA Allergy, unspecified, initial encounter: Secondary | ICD-10-CM

## 2019-10-22 DIAGNOSIS — E669 Obesity, unspecified: Secondary | ICD-10-CM

## 2019-10-22 DIAGNOSIS — E785 Hyperlipidemia, unspecified: Secondary | ICD-10-CM

## 2019-10-22 DIAGNOSIS — E038 Other specified hypothyroidism: Secondary | ICD-10-CM

## 2019-12-16 ENCOUNTER — Other Ambulatory Visit: Payer: Self-pay | Admitting: Family Medicine

## 2019-12-17 NOTE — Telephone Encounter (Signed)
Last OV 04/13/19 Last CPE 12/2018 Last refill 09/04/18 # 90/3 Next OV not scheduled

## 2020-02-17 ENCOUNTER — Other Ambulatory Visit: Payer: Self-pay | Admitting: Family Medicine

## 2020-02-17 DIAGNOSIS — E038 Other specified hypothyroidism: Secondary | ICD-10-CM

## 2020-02-17 DIAGNOSIS — E785 Hyperlipidemia, unspecified: Secondary | ICD-10-CM

## 2020-02-17 DIAGNOSIS — T7840XA Allergy, unspecified, initial encounter: Secondary | ICD-10-CM

## 2020-02-17 DIAGNOSIS — E039 Hypothyroidism, unspecified: Secondary | ICD-10-CM

## 2020-03-11 ENCOUNTER — Ambulatory Visit: Payer: Medicare Other | Attending: Internal Medicine

## 2020-03-11 DIAGNOSIS — Z23 Encounter for immunization: Secondary | ICD-10-CM

## 2020-03-11 NOTE — Progress Notes (Signed)
   Covid-19 Vaccination Clinic  Name:  AYZEL STIGALL    MRN: [id number] DOB: 07/22/1951  03/11/2020  Ms. Mccomber was observed post Covid-19 immunization for 15 minutes without incident. She was provided with Vaccine Information Sheet and instruction to access the V-Safe system.   Ms. Suri was instructed to call 911 with any severe reactions post vaccine: Marland Kitchen Difficulty breathing  . Swelling of face and throat  . A fast heartbeat  . A bad rash all over body  . Dizziness and weakness   Immunizations Administered    Name Date Dose VIS Date Route   Pfizer COVID-19 Vaccine 03/11/2020  9:31 AM 0.3 mL 12/11/2019 Intramuscular   Manufacturer: [address]   Lot: [id number]   [place]: [id number]

## 2020-03-17 ENCOUNTER — Other Ambulatory Visit: Payer: Self-pay | Admitting: Family Medicine

## 2020-04-05 ENCOUNTER — Ambulatory Visit: Payer: Medicare Other | Attending: Internal Medicine

## 2020-04-05 DIAGNOSIS — Z23 Encounter for immunization: Secondary | ICD-10-CM

## 2020-04-05 NOTE — Progress Notes (Signed)
   Covid-19 Vaccination Clinic  Name:  Jasmine Morse    MRN: [id number] DOB: Feb 28, 1951  04/05/2020  [name] was observed post Covid-19 immunization for 15 minutes without incident. She was provided with Vaccine Information Sheet and instruction to access the V-Safe system.   [name] was instructed to call 911 with any severe reactions post vaccine: Marland Kitchen Difficulty breathing  . Swelling of face and throat  . A fast heartbeat  . A bad rash all over body  . Dizziness and weakness   Immunizations Administered    Name Date Dose VIS Date Route   Pfizer COVID-19 Vaccine 04/05/2020  9:20 AM 0.3 mL 12/11/2019 Intramuscular   Manufacturer: [employer]   Lot: [id number]   [place]: [id number]

## 2020-08-04 ENCOUNTER — Other Ambulatory Visit: Payer: Self-pay | Admitting: Family Medicine

## 2020-08-05 ENCOUNTER — Telehealth: Payer: Self-pay | Admitting: Family Medicine

## 2020-08-05 MED ORDER — LISINOPRIL 5 MG PO TABS: 5.0000 mg | ORAL_TABLET | Freq: Every day | ORAL | 0 refills | Status: DC

## 2020-08-05 NOTE — Telephone Encounter (Signed)
Patient has not seen Dr. Charlett Blake since 12/2018.  Advised that she must make an appt get refill.  Also advised that she must keep appt to continue to get refills.    Rx sent in.

## 2020-08-05 NOTE — Telephone Encounter (Signed)
Medication: lisinopril (ZESTRIL) 5 MG tablet [[id number]]    Has the patient contacted their pharmacy? No. (If no, request that the patient contact the pharmacy for the refill.) (If yes, when and what did the pharmacy advise?)  Preferred Pharmacy (with phone number or street name):  [facility], [place] - [address], [place] [address]  Phone:  [phone] Fax:  [phone]  DEA #:  -- Agent: Please be advised that RX refills may take up to 3 business days. We ask that you follow-up with your pharmacy.

## 2020-08-12 ENCOUNTER — Encounter: Payer: Self-pay | Admitting: Genetic Counselor

## 2020-09-23 ENCOUNTER — Other Ambulatory Visit: Payer: Self-pay | Admitting: Family Medicine

## 2020-09-23 DIAGNOSIS — E038 Other specified hypothyroidism: Secondary | ICD-10-CM

## 2020-09-23 DIAGNOSIS — T7840XA Allergy, unspecified, initial encounter: Secondary | ICD-10-CM

## 2020-09-23 DIAGNOSIS — E785 Hyperlipidemia, unspecified: Secondary | ICD-10-CM

## 2020-09-23 DIAGNOSIS — E039 Hypothyroidism, unspecified: Secondary | ICD-10-CM

## 2020-11-08 ENCOUNTER — Other Ambulatory Visit: Payer: Self-pay

## 2020-11-08 ENCOUNTER — Ambulatory Visit (INDEPENDENT_AMBULATORY_CARE_PROVIDER_SITE_OTHER): Payer: Medicare Other | Admitting: Family Medicine

## 2020-11-08 ENCOUNTER — Encounter: Payer: Self-pay | Admitting: Family Medicine

## 2020-11-08 VITALS — BP 122/76 | HR 70 | Temp 98.0°F | Resp 16 | Wt 192.4 lb

## 2020-11-08 DIAGNOSIS — E1169 Type 2 diabetes mellitus with other specified complication: Secondary | ICD-10-CM | POA: Diagnosis not present

## 2020-11-08 DIAGNOSIS — E119 Type 2 diabetes mellitus without complications: Secondary | ICD-10-CM

## 2020-11-08 DIAGNOSIS — E669 Obesity, unspecified: Secondary | ICD-10-CM | POA: Diagnosis not present

## 2020-11-08 DIAGNOSIS — E039 Hypothyroidism, unspecified: Secondary | ICD-10-CM

## 2020-11-08 DIAGNOSIS — E6609 Other obesity due to excess calories: Secondary | ICD-10-CM | POA: Diagnosis not present

## 2020-11-08 DIAGNOSIS — E782 Mixed hyperlipidemia: Secondary | ICD-10-CM

## 2020-11-08 NOTE — Assessment & Plan Note (Signed)
Encouraged DASH diet, decrease po intake and increase exercise as tolerated. Needs 7-8 hours of sleep nightly. Avoid trans fats, eat small, frequent meals every 4-5 hours with lean proteins, complex carbs and healthy fats. Minimize simple carbs, GMO foods. 

## 2020-11-08 NOTE — Assessment & Plan Note (Signed)
On Levothyroxine, continue to monitor 

## 2020-11-08 NOTE — Progress Notes (Signed)
Subjective:    Patient ID: Jasmine Morse, female    DOB: Dec 13, 1951, 69 y.o.   MRN: [id number]  Chief Complaint  Patient presents with  . Follow-up    HPI Patient is in today for follow up on chronic medical concerns. No recent febrile illness or hospitalizations. She has been trying to stay active and maintain a heart healthy diet but does not succeed everyday. No polyuria or polydipsia. Denies CP/palp/SOB/HA/congestion/fevers/GI or GU c/o. Taking meds as prescribed  Past Medical History:  Diagnosis Date  . Allergic state 08/10/2014  . Cancer ([place])    colon cancer stage III (T3, N2b) s/p right hemicolectomy, FOLFOX-- Dr Learta Codding  . Diabetes mellitus    type II  . Hair loss 07/10/2015  . Obesity, unspecified 08/10/2014  . Preventative health care 09/09/2017  . Thyroid disease    hypothyroidism    Past Surgical History:  Procedure Laterality Date  . CESAREAN SECTION     x 3  . INNER EAR SURGERY     due to numerous ear infections  . TONSILLECTOMY      Family History  Problem Relation Age of Onset  . Hypertension Son   . Cancer Father        colon  . Cancer Brother        colon  . Diabetes Other   . Hypertension Other   . Stroke Other     Social History   Socioeconomic History  . Marital status: Widowed    Spouse name: Not on file  . Number of children: 3  . Years of education: Not on file  . Highest education level: Not on file  Occupational History  . Occupation: [employer]    Employer: [employer]  Tobacco Use  . Smoking status: Never Smoker  . Smokeless tobacco: Never Used  Substance and Sexual Activity  . Alcohol use: No  . Drug use: No  . Sexual activity: Yes  Other Topics Concern  . Not on file  Social History Narrative  . Not on file   Social Determinants of Health   Financial Resource Strain:   . Difficulty of Paying Living Expenses: Not on file  Food Insecurity:   . Worried About Charity fundraiser in the Last Year: Not on file   . Ran Out of Food in the Last Year: Not on file  Transportation Needs:   . Lack of Transportation (Medical): Not on file  . Lack of Transportation (Non-Medical): Not on file  Physical Activity:   . Days of Exercise per Week: Not on file  . Minutes of Exercise per Session: Not on file  Stress:   . Feeling of Stress : Not on file  Social Connections:   . Frequency of Communication with Friends and Family: Not on file  . Frequency of Social Gatherings with Friends and Family: Not on file  . Attends Religious Services: Not on file  . Active Member of Clubs or Organizations: Not on file  . Attends Archivist Meetings: Not on file  . Marital Status: Not on file  Intimate Partner Violence:   . Fear of Current or Ex-Partner: Not on file  . Emotionally Abused: Not on file  . Physically Abused: Not on file  . Sexually Abused: Not on file    Outpatient Medications Prior to Visit  Medication Sig Dispense Refill  . atorvastatin (LIPITOR) 10 MG tablet Take 1 tablet (10 mg total) by mouth daily. 90 tablet 3  .  Blood Glucose Monitoring Suppl (ACCU-CHEK GUIDE) w/Device KIT 1 kit by Does not apply route as directed. 1 kit 0  . EZ SMART BLOOD GLUCOSE LANCETS MISC 1 strip by Does not apply route daily. 100 each 12  . glipiZIDE (GLUCOTROL) 5 MG tablet Take 1 tablet (5 mg total) by mouth daily. 90 tablet 1  . glucose blood test strip Use as instructed 100 each 12  . hydrOXYzine (ATARAX/VISTARIL) 10 MG tablet 1-2 tab po q 8 hours as needed itching 30 tablet 0  . insulin aspart (NOVOLOG) 100 UNIT/ML injection Inject 5 Units into the skin 3 (three) times daily with meals. 3 vial PRN  . levothyroxine (SYNTHROID) 88 MCG tablet TAKE 1 TABLET BY MOUTH ONCE DAILY BEFOREBREAKFAST 90 tablet 1  . lisinopril (ZESTRIL) 5 MG tablet Take 1 tablet (5 mg total) by mouth daily. 90 tablet 0  . meclizine (ANTIVERT) 12.5 MG tablet TAKE 1 TABLET BY MOUTH TWICE DAILY    . meclizine (ANTIVERT) 12.5 MG tablet Take  12.5 mg by mouth 2 (two) times daily.    . metFORMIN (GLUCOPHAGE) 1000 MG tablet TAKE 1 TABLET BY MOUTH TWICE DAILY WITH A MEAL 180 tablet 1  . predniSONE (STERAPRED UNI-PAK 21 TAB) 10 MG (21) TBPK tablet Taper over 6 days 21 tablet 0  . sulfamethoxazole-trimethoprim (BACTRIM DS,SEPTRA DS) 800-160 MG tablet Take 1 tablet by mouth 2 (two) times daily with a meal.    . SUMAtriptan (IMITREX) 100 MG tablet Take by mouth.     No facility-administered medications prior to visit.    Allergies  Allergen Reactions  . Codeine     REACTION: Nausea \T\ Diarrhea    Review of Systems  Constitutional: Negative for fever and malaise/fatigue.  HENT: Negative for congestion.   Eyes: Negative for blurred vision.  Respiratory: Negative for shortness of breath.   Cardiovascular: Negative for chest pain, palpitations and leg swelling.  Gastrointestinal: Negative for abdominal pain, blood in stool and nausea.  Genitourinary: Negative for dysuria and frequency.  Musculoskeletal: Negative for falls.  Skin: Negative for rash.  Neurological: Negative for dizziness, loss of consciousness and headaches.  Endo/Heme/Allergies: Negative for environmental allergies.  Psychiatric/Behavioral: Negative for depression. The patient is not nervous/anxious.        Objective:    Physical Exam Vitals and nursing note reviewed.  Constitutional:      General: She is not in acute distress.    Appearance: She is well-developed.  HENT:     Head: Normocephalic and atraumatic.     Nose: Nose normal.  Eyes:     General:        Right eye: No discharge.        Left eye: No discharge.  Cardiovascular:     Rate and Rhythm: Normal rate and regular rhythm.     Heart sounds: No murmur heard.   Pulmonary:     Effort: Pulmonary effort is normal.     Breath sounds: Normal breath sounds.  Abdominal:     General: Bowel sounds are normal.     Palpations: Abdomen is soft.     Tenderness: There is no abdominal tenderness.    Musculoskeletal:     Cervical back: Normal range of motion and neck supple.  Skin:    General: Skin is warm and dry.  Neurological:     Mental Status: She is alert and oriented to person, place, and time.     BP 122/76 (BP Location: Left Arm)   Pulse 70  Temp 98 F (36.7 C)   Resp 16   Wt 192 lb 6.4 oz (87.3 kg)   SpO2 98%   BMI 35.19 kg/m  Wt Readings from Last 3 Encounters:  11/08/20 192 lb 6.4 oz (87.3 kg)  03/02/19 210 lb (95.3 kg)  01/05/19 213 lb 3.2 oz (96.7 kg)    Diabetic Foot Exam - Simple   No data filed     Lab Results  Component Value Date   WBC 7.0 03/02/2019   HGB 12.9 03/02/2019   HCT 38.5 03/02/2019   PLT 250 03/02/2019   GLUCOSE 232 (H) 03/02/2019   CHOL 266 (H) 01/05/2019   TRIG 281.0 (H) 01/05/2019   HDL 61.90 01/05/2019   LDLDIRECT 176.0 01/05/2019   LDLCALC NOT CALC 06/24/2015   ALT 29 03/02/2019   AST 30 03/02/2019   NA 137 03/02/2019   K 3.9 03/02/2019   CL 100 03/02/2019   CREATININE 0.84 03/02/2019   BUN 10 03/02/2019   CO2 28 03/02/2019   TSH 2.12 01/05/2019   HGBA1C 8.7 (H) 01/05/2019   MICROALBUR <0.7 02/06/2018    Lab Results  Component Value Date   TSH 2.12 01/05/2019   Lab Results  Component Value Date   WBC 7.0 03/02/2019   HGB 12.9 03/02/2019   HCT 38.5 03/02/2019   MCV 91.4 03/02/2019   PLT 250 03/02/2019   Lab Results  Component Value Date   NA 137 03/02/2019   K 3.9 03/02/2019   CO2 28 03/02/2019   GLUCOSE 232 (H) 03/02/2019   BUN 10 03/02/2019   CREATININE 0.84 03/02/2019   BILITOT 0.7 03/02/2019   ALKPHOS 60 03/02/2019   AST 30 03/02/2019   ALT 29 03/02/2019   PROT 7.0 03/02/2019   ALBUMIN 3.8 03/02/2019   CALCIUM 9.3 03/02/2019   ANIONGAP 9 03/02/2019   GFR 83.08 01/05/2019   Lab Results  Component Value Date   CHOL 266 (H) 01/05/2019   Lab Results  Component Value Date   HDL 61.90 01/05/2019   Lab Results  Component Value Date   LDLCALC NOT CALC 06/24/2015   Lab Results   Component Value Date   TRIG 281.0 (H) 01/05/2019   Lab Results  Component Value Date   CHOLHDL 4 01/05/2019   Lab Results  Component Value Date   HGBA1C 8.7 (H) 01/05/2019       Assessment & Plan:   Problem List Items Addressed This Visit    Hypothyroidism - Primary    On Levothyroxine, continue to monitor      Relevant Orders   CBC   TSH   Diabetes mellitus type 2 in obese (HCC)    hgba1c acceptable, minimize simple carbs. Increase exercise as tolerated. Continue current meds      Relevant Orders   CBC   Comprehensive metabolic panel   Hemoglobin A1c   Obesity    Encouraged DASH diet, decrease po intake and increase exercise as tolerated. Needs 7-8 hours of sleep nightly. Avoid trans fats, eat small, frequent meals every 4-5 hours with lean proteins, complex carbs and healthy fats. Minimize simple carbs, GMO foods.      Hyperlipidemia    Encouraged heart healthy diet, increase exercise, avoid trans fats, consider a krill oil cap daily. Tolerating Atorvastatin      Relevant Orders   CBC   Lipid panel      I have discontinued Jazmeen W. Bellefeuille's sulfamethoxazole-trimethoprim, SUMAtriptan, and predniSONE. I am also having her maintain her glipiZIDE, Ez  Smart Blood Glucose Lancets, atorvastatin, meclizine, meclizine, hydrOXYzine, insulin aspart, Accu-Chek Guide, glucose blood, metFORMIN, lisinopril, and levothyroxine.  No orders of the defined types were placed in this encounter.    Penni Homans, MD

## 2020-11-08 NOTE — Assessment & Plan Note (Addendum)
Encouraged heart healthy diet, increase exercise, avoid trans fats, consider a krill oil cap daily. Tolerating Atorvastatin 

## 2020-11-08 NOTE — Assessment & Plan Note (Signed)
hgba1c acceptable, minimize simple carbs. Increase exercise as tolerated. Continue current meds 

## 2020-11-08 NOTE — Patient Instructions (Addendum)
Shingrix is the new shingles shot 2 shots over 2-6 months Pneumovax High dose flu shot COVID booster   DASH Eating Plan DASH stands for "Dietary Approaches to Stop Hypertension." The DASH eating plan is a healthy eating plan that has been shown to reduce high blood pressure (hypertension). It may also reduce your risk for type 2 diabetes, heart disease, and stroke. The DASH eating plan may also help with weight loss. What are tips for following this plan?  General guidelines  Avoid eating more than 2,300 mg (milligrams) of salt (sodium) a day. If you have hypertension, you may need to reduce your sodium intake to 1,500 mg a day.  Limit alcohol intake to no more than 1 drink a day for nonpregnant women and 2 drinks a day for men. One drink equals 12 oz of beer, 5 oz of wine, or 1 oz of hard liquor.  Work with your health care provider to maintain a healthy body weight or to lose weight. Ask what an ideal weight is for you.  Get at least 30 minutes of exercise that causes your heart to beat faster (aerobic exercise) most days of the week. Activities may include walking, swimming, or biking.  Work with your health care provider or diet and nutrition specialist (dietitian) to adjust your eating plan to your individual calorie needs. Reading food labels   Check food labels for the amount of sodium per serving. Choose foods with less than 5 percent of the Daily Value of sodium. Generally, foods with less than 300 mg of sodium per serving fit into this eating plan.  To find whole grains, look for the word "whole" as the first word in the ingredient list. Shopping  Buy products labeled as "low-sodium" or "no salt added."  Buy fresh foods. Avoid canned foods and premade or frozen meals. Cooking  Avoid adding salt when cooking. Use salt-free seasonings or herbs instead of table salt or sea salt. Check with your health care provider or pharmacist before using salt substitutes.  Do not fry  foods. Cook foods using healthy methods such as baking, boiling, grilling, and broiling instead.  Cook with heart-healthy oils, such as olive, canola, soybean, or sunflower oil. Meal planning  Eat a balanced diet that includes: ? 5 or more servings of fruits and vegetables each day. At each meal, try to fill half of your plate with fruits and vegetables. ? Up to 6-8 servings of whole grains each day. ? Less than 6 oz of lean meat, poultry, or fish each day. A 3-oz serving of meat is about the same size as a deck of cards. One egg equals 1 oz. ? 2 servings of low-fat dairy each day. ? A serving of nuts, seeds, or beans 5 times each week. ? Heart-healthy fats. Healthy fats called Omega-3 fatty acids are found in foods such as flaxseeds and coldwater fish, like sardines, salmon, and mackerel.  Limit how much you eat of the following: ? Canned or prepackaged foods. ? Food that is high in trans fat, such as fried foods. ? Food that is high in saturated fat, such as fatty meat. ? Sweets, desserts, sugary drinks, and other foods with added sugar. ? Full-fat dairy products.  Do not salt foods before eating.  Try to eat at least 2 vegetarian meals each week.  Eat more home-cooked food and less restaurant, buffet, and fast food.  When eating at a restaurant, ask that your food be prepared with less salt or no salt,  if possible. What foods are recommended? The items listed may not be a complete list. Talk with your dietitian about what dietary choices are best for you. Grains Whole-grain or whole-wheat bread. Whole-grain or whole-wheat pasta. Brown rice. [place]. Bulgur. Whole-grain and low-sodium cereals. Pita bread. Low-fat, low-sodium crackers. Whole-wheat flour tortillas. Vegetables Fresh or frozen vegetables (raw, steamed, roasted, or grilled). Low-sodium or reduced-sodium tomato and vegetable juice. Low-sodium or reduced-sodium tomato sauce and tomato paste. Low-sodium or  reduced-sodium canned vegetables. Fruits All fresh, dried, or frozen fruit. Canned fruit in natural juice (without added sugar). Meat and other protein foods Skinless chicken or [place]. Ground chicken or [place]. Pork with fat trimmed off. Fish and seafood. Egg whites. Dried beans, peas, or lentils. Unsalted nuts, nut butters, and seeds. Unsalted canned beans. Lean cuts of beef with fat trimmed off. Low-sodium, lean deli meat. Dairy Low-fat (1%) or fat-free (skim) milk. Fat-free, low-fat, or reduced-fat cheeses. Nonfat, low-sodium ricotta or cottage cheese. Low-fat or nonfat yogurt. Low-fat, low-sodium cheese. Fats and oils Soft margarine without trans fats. Vegetable oil. Low-fat, reduced-fat, or light mayonnaise and salad dressings (reduced-sodium). Canola, safflower, olive, soybean, and sunflower oils. Avocado. Seasoning and other foods Herbs. Spices. Seasoning mixes without salt. Unsalted popcorn and pretzels. Fat-free sweets. What foods are not recommended? The items listed may not be a complete list. Talk with your dietitian about what dietary choices are best for you. Grains Baked goods made with fat, such as croissants, muffins, or some breads. Dry pasta or rice meal packs. Vegetables Creamed or fried vegetables. Vegetables in a cheese sauce. Regular canned vegetables (not low-sodium or reduced-sodium). Regular canned tomato sauce and paste (not low-sodium or reduced-sodium). Regular tomato and vegetable juice (not low-sodium or reduced-sodium). Angie Fava. Olives. Fruits Canned fruit in a light or heavy syrup. Fried fruit. Fruit in cream or butter sauce. Meat and other protein foods Fatty cuts of meat. Ribs. Fried meat. Berniece Salines. Sausage. Bologna and other processed lunch meats. Salami. Fatback. Hotdogs. Bratwurst. Salted nuts and seeds. Canned beans with added salt. Canned or smoked fish. Whole eggs or egg yolks. Chicken or [place] with skin. Dairy Whole or 2% milk, cream, and half-and-half.  Whole or full-fat cream cheese. Whole-fat or sweetened yogurt. Full-fat cheese. Nondairy creamers. Whipped toppings. Processed cheese and cheese spreads. Fats and oils Butter. Stick margarine. Lard. Shortening. Ghee. Bacon fat. Tropical oils, such as coconut, palm kernel, or palm oil. Seasoning and other foods Salted popcorn and pretzels. Onion salt, garlic salt, seasoned salt, table salt, and sea salt. Worcestershire sauce. Tartar sauce. Barbecue sauce. Teriyaki sauce. Soy sauce, including reduced-sodium. Steak sauce. Canned and packaged gravies. Fish sauce. Oyster sauce. Cocktail sauce. Horseradish that you find on the shelf. Ketchup. Mustard. Meat flavorings and tenderizers. Bouillon cubes. Hot sauce and Tabasco sauce. Premade or packaged marinades. Premade or packaged taco seasonings. Relishes. Regular salad dressings. Where to find more information:  National Heart, Lung, and [place]: https://wilson-eaton.com/  American Heart Association: www.heart.org Summary  The DASH eating plan is a healthy eating plan that has been shown to reduce high blood pressure (hypertension). It may also reduce your risk for type 2 diabetes, heart disease, and stroke.  With the DASH eating plan, you should limit salt (sodium) intake to 2,300 mg a day. If you have hypertension, you may need to reduce your sodium intake to 1,500 mg a day.  When on the DASH eating plan, aim to eat more fresh fruits and vegetables, whole grains, lean proteins, low-fat dairy, and heart-healthy fats.  Work with your health care provider or diet and nutrition specialist (dietitian) to adjust your eating plan to your individual calorie needs. This information is not intended to replace advice given to you by your health care provider. Make sure you discuss any questions you have with your health care provider. Document Revised: 11/29/2017 Document Reviewed: 12/10/2016 Elsevier Patient Education  2020 [employer].

## 2020-11-09 LAB — LDL CHOLESTEROL, DIRECT: Direct LDL: 173 mg/dL

## 2020-11-09 LAB — CBC
HCT: 39.7 % (ref 36.0–46.0)
Hemoglobin: 13.4 g/dL (ref 12.0–15.0)
MCHC: 33.9 g/dL (ref 30.0–36.0)
MCV: 90.4 fl (ref 78.0–100.0)
Platelets: 253 10*3/uL (ref 150.0–400.0)
RBC: 4.39 Mil/uL (ref 3.87–5.11)
RDW: 13.3 % (ref 11.5–15.5)
WBC: 6.1 10*3/uL (ref 4.0–10.5)

## 2020-11-09 LAB — COMPREHENSIVE METABOLIC PANEL
ALT: 12 U/L (ref 0–35)
AST: 18 U/L (ref 0–37)
Albumin: 4.3 g/dL (ref 3.5–5.2)
Alkaline Phosphatase: 60 U/L (ref 39–117)
BUN: 21 mg/dL (ref 6–23)
CO2: 29 mEq/L (ref 19–32)
Calcium: 9.5 mg/dL (ref 8.4–10.5)
Chloride: 99 mEq/L (ref 96–112)
Creatinine, Ser: 0.79 mg/dL (ref 0.40–1.20)
GFR: 76.36 mL/min (ref >60.00)
Glucose, Bld: 105 mg/dL — ABNORMAL HIGH (ref 70–99)
Potassium: 4.4 mEq/L (ref 3.5–5.1)
Sodium: 138 mEq/L (ref 135–145)
Total Bilirubin: 0.4 mg/dL (ref 0.2–1.2)
Total Protein: 7.1 g/dL (ref 6.0–8.3)

## 2020-11-09 LAB — TSH: TSH: 0.49 u[IU]/mL (ref 0.35–4.50)

## 2020-11-09 LAB — LIPID PANEL
Cholesterol: 265 mg/dL — ABNORMAL HIGH (ref 0–200)
HDL: 64.6 mg/dL (ref >39.00)
NonHDL: 200.09
Total CHOL/HDL Ratio: 4
Triglycerides: 305 mg/dL — ABNORMAL HIGH (ref 0.0–149.0)
VLDL: 61 mg/dL — ABNORMAL HIGH (ref 0.0–40.0)

## 2020-11-09 LAB — HEMOGLOBIN A1C: Hgb A1c MFr Bld: 7.5 % — ABNORMAL HIGH (ref 4.6–6.5)

## 2020-11-10 ENCOUNTER — Telehealth: Payer: Self-pay

## 2020-11-10 NOTE — Telephone Encounter (Signed)
Pt called to follow up on medications following labs and schedule lab f/u appointment for 3 months.

## 2020-11-10 NOTE — Telephone Encounter (Signed)
-----   Message from Mosie Lukes, MD sent at 11/09/2020  5:03 PM EST ----- Notify her labs look good except cholesterol is up. She has had a Atorvastatin in the past. Check to see why she stopped it. If no reason and she is willing to restart we can restart 10 mg qhs, is she did not tolerate have her try Rosuvastatin 5 mg tabs, 1 tab po qhs. It is water soluble and is better tolerated than Atorvastatin. Disp #30 with 3 rf. Recheck all labs in 3 months.

## 2020-12-05 ENCOUNTER — Other Ambulatory Visit: Payer: Self-pay | Admitting: Family Medicine

## 2020-12-05 ENCOUNTER — Telehealth: Payer: Self-pay | Admitting: Family Medicine

## 2020-12-05 NOTE — Telephone Encounter (Signed)
Patient states she needs a glucometer for diabetic purposes and testing supplies.   Has the patient contacted their pharmacy? No. (If no, request that the patient contact the pharmacy for the refill.) (If yes, when and what did the pharmacy advise?)  Preferred Pharmacy (with phone number or street name):  [facility], [place] - [address] Phone:  [phone]  Fax:  [phone]       Agent: Please be advised that RX refills may take up to 3 business days. We ask that you follow-up with your pharmacy.

## 2020-12-06 ENCOUNTER — Other Ambulatory Visit: Payer: Self-pay

## 2020-12-06 DIAGNOSIS — E1169 Type 2 diabetes mellitus with other specified complication: Secondary | ICD-10-CM

## 2020-12-06 MED ORDER — EZ SMART BLOOD GLUCOSE LANCETS MISC: 1.0000 | Freq: Every day | 12 refills | Status: AC

## 2020-12-06 MED ORDER — ACCU-CHEK GUIDE W/DEVICE KIT: 1.0000 | PACK | 0 refills | Status: AC

## 2020-12-06 NOTE — Telephone Encounter (Signed)
Sent to the pharmacy requested 

## 2020-12-13 ENCOUNTER — Encounter: Payer: Self-pay | Admitting: Family Medicine

## 2020-12-13 ENCOUNTER — Other Ambulatory Visit (HOSPITAL_BASED_OUTPATIENT_CLINIC_OR_DEPARTMENT_OTHER): Payer: Self-pay | Admitting: Internal Medicine

## 2020-12-13 ENCOUNTER — Other Ambulatory Visit: Payer: Self-pay

## 2020-12-13 ENCOUNTER — Ambulatory Visit (INDEPENDENT_AMBULATORY_CARE_PROVIDER_SITE_OTHER): Payer: Medicare Other | Admitting: Family Medicine

## 2020-12-13 ENCOUNTER — Ambulatory Visit: Payer: Medicare Other | Attending: Internal Medicine

## 2020-12-13 VITALS — BP 122/82 | HR 71 | Temp 97.7°F | Ht 63.0 in | Wt 193.5 lb

## 2020-12-13 DIAGNOSIS — H00015 Hordeolum externum left lower eyelid: Secondary | ICD-10-CM

## 2020-12-13 DIAGNOSIS — H1032 Unspecified acute conjunctivitis, left eye: Secondary | ICD-10-CM | POA: Diagnosis not present

## 2020-12-13 DIAGNOSIS — Z23 Encounter for immunization: Secondary | ICD-10-CM

## 2020-12-13 MED ORDER — POLYMYXIN B-TRIMETHOPRIM 10000-0.1 UNIT/ML-% OP SOLN: 2.0000 [drp] | Freq: Four times a day (QID) | OPHTHALMIC | 0 refills | Status: DC

## 2020-12-13 NOTE — Patient Instructions (Addendum)
Artificial tears like Refresh and Systane may be used for comfort. OK to get generic version. Generally people use them every 2-4 hours, but you can use them as much as you want because there is no medication in it.  Try not to touch your face.  Use warm compresses as needed.    Let us know if you need anything.

## 2020-12-13 NOTE — Progress Notes (Signed)
Chief Complaint  Patient presents with  . Eye Drainage    Jasmine Morse is here for left eye irritation.  Duration: 1 week Chemical exposure? No  Recent URI? No  Contact lenses? No  Treatment to date: Allergy drops Denies fevers, vision changes, eye pain.  Past Medical History:  Diagnosis Date  . Allergic state 08/10/2014  . Cancer ([place])    colon cancer stage III (T3, N2b) s/p right hemicolectomy, FOLFOX-- Dr Learta Codding  . Diabetes mellitus    type II  . Hair loss 07/10/2015  . Obesity, unspecified 08/10/2014  . Preventative health care 09/09/2017  . Thyroid disease    hypothyroidism   Family History  Problem Relation Age of Onset  . Hypertension Son   . Cancer Father        colon  . Cancer Brother        colon  . Diabetes Other   . Hypertension Other   . Stroke Other     BP 122/82 (BP Location: Left Arm, Patient Position: Sitting, Cuff Size: Normal)   Pulse 71   Temp 97.7 F (36.5 C) (Oral)   Ht 5\' 3"  (1.6 m)   Wt 193 lb 8 oz (87.8 kg)   SpO2 99%   BMI 34.28 kg/m  Gen: Awake, alert, appears stated age Eyes: Lid normal on R, injected with a small lateral lump on the lower left lid, Sclera white on the right, injected on the left, PERRLA, EOMi; there is no tenderness to palpation with light pressure over the globes Lungs: No accessory muscle use Psych: Age appropriate judgment and insight; mood and affect normal  Hordeolum of left lower eyelid, unspecified hordeolum type  Acute conjunctivitis of left eye, unspecified acute conjunctivitis type - Plan: trimethoprim-polymyxin b (POLYTRIM) ophthalmic solution  Orders as above. Instructed to practice good hand hygiene and try not to touch face. Warm compresses and artificial tears also recommended. F/u if no improvement in 7-10 days. Pt voiced understanding and agreement to the plan.  [place], DO 12/13/20 11:40 AM

## 2020-12-13 NOTE — Progress Notes (Signed)
   Covid-19 Vaccination Clinic  Name:  Jasmine Morse    MRN: [id number] DOB: March 11, 1951  12/13/2020  Jasmine Morse was observed post Covid-19 immunization for 15 minutes without incident. She was provided with Vaccine Information Sheet and instruction to access the V-Safe system.   Jasmine Morse was instructed to call 911 with any severe reactions post vaccine: Marland Kitchen Difficulty breathing  . Swelling of face and throat  . A fast heartbeat  . A bad rash all over body  . Dizziness and weakness   Immunizations Administered    Name Date Dose VIS Date Route   Pfizer COVID-19 Vaccine 12/13/2020 11:36 AM 0.3 mL 10/19/2020 Intramuscular   Manufacturer: [place]   Lot: [id number]   [place]: [id number]

## 2020-12-19 MED FILL — PFIZER-BIONTECH COVID-19 VA: 30 | 1 days supply | Qty: 0 | Fill #0

## 2021-05-09 ENCOUNTER — Ambulatory Visit: Payer: Medicare Other | Admitting: Family Medicine

## 2021-05-09 ENCOUNTER — Other Ambulatory Visit: Payer: Self-pay | Admitting: Family Medicine

## 2021-05-09 DIAGNOSIS — T7840XA Allergy, unspecified, initial encounter: Secondary | ICD-10-CM

## 2021-05-09 DIAGNOSIS — E038 Other specified hypothyroidism: Secondary | ICD-10-CM

## 2021-05-09 DIAGNOSIS — E785 Hyperlipidemia, unspecified: Secondary | ICD-10-CM

## 2021-05-09 DIAGNOSIS — E039 Hypothyroidism, unspecified: Secondary | ICD-10-CM

## 2021-05-16 ENCOUNTER — Ambulatory Visit: Payer: Medicare Other | Admitting: Family Medicine

## 2021-06-05 ENCOUNTER — Encounter: Payer: Self-pay | Admitting: Family Medicine

## 2021-06-05 ENCOUNTER — Other Ambulatory Visit: Payer: Self-pay

## 2021-06-05 ENCOUNTER — Ambulatory Visit (INDEPENDENT_AMBULATORY_CARE_PROVIDER_SITE_OTHER): Payer: Medicare Other | Admitting: Family Medicine

## 2021-06-05 VITALS — BP 116/68 | HR 74 | Temp 97.9°F | Resp 16 | Wt 186.0 lb

## 2021-06-05 DIAGNOSIS — E669 Obesity, unspecified: Secondary | ICD-10-CM

## 2021-06-05 DIAGNOSIS — E039 Hypothyroidism, unspecified: Secondary | ICD-10-CM

## 2021-06-05 DIAGNOSIS — E1169 Type 2 diabetes mellitus with other specified complication: Secondary | ICD-10-CM | POA: Diagnosis not present

## 2021-06-05 DIAGNOSIS — E6609 Other obesity due to excess calories: Secondary | ICD-10-CM

## 2021-06-05 DIAGNOSIS — L258 Unspecified contact dermatitis due to other agents: Secondary | ICD-10-CM

## 2021-06-05 DIAGNOSIS — E782 Mixed hyperlipidemia: Secondary | ICD-10-CM

## 2021-06-05 NOTE — Patient Instructions (Addendum)
Dawn dishwashing liquid for after working in the yard. Cleanse the affected area with [place] after washing with dawn  Zyrtec/Cetirizine 10 mg up to twice a day and Bendryl 25 mg at bedtime  Pepcid/Famotidine 20 mg tab 1 tab twice a day whenever you are fighting poison ivy etc  Benadryl cream or steroid cream such cortaid and calamine  Shingrix is the new shingles shot, 2 shots over 2-6 months, confirm coverage with insurance and document, then can return here for shots with nurse appt or at pharmacy  Take the second COVID booster when you are willing  Prevnar 13 now and Pneumovax 23 a year later   Mammogram and Dexa Bone scan let us know if you want them and I will order them [facility]  Drink 60-80 ounces of water and clear fluids daily    Poison Ivy Dermatitis Poison ivy dermatitis is redness and soreness of the skin caused by chemicals in the leaves of the poison ivy plant. You may have very bad itching, swelling, a rash, and blisters. What are the causes?  Touching a poison ivy plant.  Touching something that has the chemical on it. This may include animals or objects that have come in contact with the plant. What increases the risk?  Going outdoors often in wooded or [place] areas.  Going outdoors without wearing protective clothing, such as closed shoes, long pants, and a long-sleeved shirt. What are the signs or symptoms?  Skin redness.  Very bad itching.  A rash that often includes bumps and blisters. ? The rash usually appears 48 hours after exposure, if you have been exposed before. ? If this is the first time you have been exposed, the rash may not appear until a week after exposure.  Swelling. This may occur if the reaction is very bad. Symptoms usually last for 1-2 weeks. The first time you develop this condition, symptoms may last 3-4 weeks.   How is this treated? This condition may be treated with:  Hydrocortisone cream or  calamine lotion to relieve itching.  Oatmeal baths to soothe the skin.  Medicines, such as over-the-counter antihistamine tablets.  Oral steroid medicine for more severe reactions. Follow these instructions at home: Medicines  Take or apply over-the-counter and prescription medicines only as told by your doctor.  Use hydrocortisone cream or calamine lotion as needed to help with itching. General instructions  Do not scratch or rub your skin.  Put a cold, wet cloth (cold compress) on the affected areas or take baths in cool water. This will help with itching.  Avoid hot baths and showers.  Take oatmeal baths as needed. Use colloidal oatmeal. You can get this at a pharmacy or grocery store. Follow the instructions on the package.  While you have the rash, wash your clothes right after you wear them.  Keep all follow-up visits as told by your health care provider. This is important. How is this prevented?  Know what poison ivy looks like, so you can avoid it. ? This plant has three leaves with flowering branches on a single stem. ? The leaves are glossy. ? The leaves have uneven edges that come to a point at the front.  If you touch poison ivy, wash your skin with soap and water right away. Be sure to wash under your fingernails.  When hiking or camping, wear long pants, a long-sleeved shirt, tall socks, and hiking boots. You can also use a lotion on your skin that  helps to prevent contact with poison ivy.  If you think that your clothes or outdoor gear came in contact with poison ivy, rinse them off with a garden hose before you bring them inside your house.  When doing yard work or gardening, wear gloves, long sleeves, long pants, and boots. Wash your garden tools and gloves if they come in contact with poison ivy.  If you think that your pet has come into contact with poison ivy, wash him or her with pet shampoo and water. Make sure to wear gloves while washing your pet.    Contact a doctor if:  You have open sores in the rash area.  You have more redness, swelling, or pain in the rash area.  You have redness that spreads beyond the rash area.  You have fluid, blood, or pus coming from the rash area.  You have a fever.  You have a rash over a large area of your body.  You have a rash on your eyes, mouth, or genitals.  Your rash does not get better after a few weeks. Get help right away if:  Your face swells or your eyes swell shut.  You have trouble breathing.  You have trouble swallowing. These symptoms may be an emergency. Do not wait to see if the symptoms will go away. Get medical help right away. Call your local emergency services (911 in the [place] Do not drive yourself to the hospital. Summary  Poison ivy dermatitis is redness and soreness of the skin caused by chemicals in the leaves of the poison ivy plant.  You may have skin redness, very bad itching, swelling, and a rash.  Do not scratch or rub your skin.  Take or apply over-the-counter and prescription medicines only as told by your doctor. This information is not intended to replace advice given to you by your health care provider. Make sure you discuss any questions you have with your health care provider. Document Revised: 04/10/2019 Document Reviewed: 12/12/2018 Elsevier Patient Education  2021 [employer].

## 2021-06-06 LAB — COMPREHENSIVE METABOLIC PANEL
ALT: 9 U/L (ref 0–35)
AST: 13 U/L (ref 0–37)
Albumin: 4.3 g/dL (ref 3.5–5.2)
Alkaline Phosphatase: 72 U/L (ref 39–117)
BUN: 16 mg/dL (ref 6–23)
CO2: 29 mEq/L (ref 19–32)
Calcium: 9.8 mg/dL (ref 8.4–10.5)
Chloride: 100 mEq/L (ref 96–112)
Creatinine, Ser: 0.77 mg/dL (ref 0.40–1.20)
GFR: 78.43 mL/min (ref >60.00)
Glucose, Bld: 181 mg/dL — ABNORMAL HIGH (ref 70–99)
Potassium: 4.4 mEq/L (ref 3.5–5.1)
Sodium: 140 mEq/L (ref 135–145)
Total Bilirubin: 0.5 mg/dL (ref 0.2–1.2)
Total Protein: 7.2 g/dL (ref 6.0–8.3)

## 2021-06-06 LAB — LIPID PANEL
Cholesterol: 282 mg/dL — ABNORMAL HIGH (ref 0–200)
HDL: 59.6 mg/dL (ref >39.00)
NonHDL: 222.78
Total CHOL/HDL Ratio: 5
Triglycerides: 360 mg/dL — ABNORMAL HIGH (ref 0.0–149.0)
VLDL: 72 mg/dL — ABNORMAL HIGH (ref 0.0–40.0)

## 2021-06-06 LAB — CBC
HCT: 39.7 % (ref 36.0–46.0)
Hemoglobin: 13.5 g/dL (ref 12.0–15.0)
MCHC: 34.1 g/dL (ref 30.0–36.0)
MCV: 90.1 fl (ref 78.0–100.0)
Platelets: 238 10*3/uL (ref 150.0–400.0)
RBC: 4.4 Mil/uL (ref 3.87–5.11)
RDW: 13.6 % (ref 11.5–15.5)
WBC: 5.1 10*3/uL (ref 4.0–10.5)

## 2021-06-06 LAB — HEMOGLOBIN A1C: Hgb A1c MFr Bld: 7.7 % — ABNORMAL HIGH (ref 4.6–6.5)

## 2021-06-06 LAB — LDL CHOLESTEROL, DIRECT: Direct LDL: 165 mg/dL

## 2021-06-06 LAB — TSH: TSH: 0.82 u[IU]/mL (ref 0.35–4.50)

## 2021-06-07 ENCOUNTER — Other Ambulatory Visit: Payer: Self-pay

## 2021-06-07 MED ORDER — ATORVASTATIN CALCIUM 20 MG PO TABS: 20.0000 mg | ORAL_TABLET | Freq: Every day | ORAL | 1 refills | Status: DC

## 2021-06-10 DIAGNOSIS — L259 Unspecified contact dermatitis, unspecified cause: Secondary | ICD-10-CM | POA: Insufficient documentation

## 2021-06-10 NOTE — Assessment & Plan Note (Signed)
Encouraged DASH or MIND diet, decrease po intake and increase exercise as tolerated. Needs 7-8 hours of sleep nightly. Avoid trans fats, eat small, frequent meals every 4-5 hours with lean proteins, complex carbs and healthy fats. Minimize simple carbs, high fat foods and processed foods 

## 2021-06-10 NOTE — Assessment & Plan Note (Signed)
On Levothyroxine, continue to monitor 

## 2021-06-10 NOTE — Assessment & Plan Note (Signed)
She has been off ov the Atorvastatin for several months but she agrees to restart it. Encourage heart healthy diet such as MIND or DASH diet, increase exercise, avoid trans fats, simple carbohydrates and processed foods, consider a krill or fish or flaxseed oil cap daily.

## 2021-06-10 NOTE — Progress Notes (Signed)
Subjective:    Patient ID: Jasmine Morse, female    DOB: 05/01/1951, 70 y.o.   MRN: [id number]  Chief Complaint  Patient presents with   Follow-up    Pt has no concerns or problems   Diabetes   Hypothyroidism    HPI Patient is in today for follow up on chronic medical concerns including diabetes, hypertension and more. No recent febrile illness or hospitalizations. She has been working on clearing out her yard and has gotten into some poison ivy, her rash is resolving but has been bad at times. She is trying to stay active and eat better since she left her husband. Denies CP/palp/SOB/HA/congestion/fevers/GI or GU c/o. Taking meds as prescribed   Past Medical History:  Diagnosis Date   Allergic state 08/10/2014   Cancer ([place])    colon cancer stage III (T3, N2b) s/p right hemicolectomy, FOLFOX-- Dr Learta Codding   Diabetes mellitus    type II   Hair loss 07/10/2015   Obesity, unspecified 08/10/2014   Preventative health care 09/09/2017   Thyroid disease    hypothyroidism    Past Surgical History:  Procedure Laterality Date   CESAREAN SECTION     x 3   INNER EAR SURGERY     due to numerous ear infections   TONSILLECTOMY      Family History  Problem Relation Age of Onset   Hypertension Son    Cancer Father        colon   Cancer Brother        colon   Diabetes Other    Hypertension Other    Stroke Other     Social History   Socioeconomic History   Marital status: Widowed    Spouse name: Not on file   Number of children: 3   Years of education: Not on file   Highest education level: Not on file  Occupational History   Occupation: [employer]    Employer: [employer]  Tobacco Use   Smoking status: Never   Smokeless tobacco: Never  Substance and Sexual Activity   Alcohol use: No   Drug use: No   Sexual activity: Yes  Other Topics Concern   Not on file  Social History Narrative   Not on file   Social Determinants of Health   Financial Resource  Strain: Not on file  Food Insecurity: Not on file  Transportation Needs: Not on file  Physical Activity: Not on file  Stress: Not on file  Social Connections: Not on file  Intimate Partner Violence: Not on file    Outpatient Medications Prior to Visit  Medication Sig Dispense Refill   Blood Glucose Monitoring Suppl (ACCU-CHEK GUIDE) w/Device KIT 1 kit by Does not apply route as directed. 1 kit 0   COVID-19 mRNA vaccine, Pfizer, 30 MCG/0.3ML injection INJECT AS DIRECTED .3 mL 0   Ez Smart Blood Glucose Lancets MISC 1 strip by Does not apply route daily. 100 each 12   glucose blood test strip Use as instructed 100 each 12   levothyroxine (SYNTHROID) 88 MCG tablet TAKE 1 TABLET BY MOUTH ONCE DAILY BEFOREBREAKFAST 90 tablet 1   lisinopril (ZESTRIL) 5 MG tablet Take 1 tablet (5 mg total) by mouth daily. 90 tablet 1   meclizine (ANTIVERT) 12.5 MG tablet Take 12.5 mg by mouth 2 (two) times daily.     metFORMIN (GLUCOPHAGE) 1000 MG tablet TAKE 1 TABLET BY MOUTH TWICE DAILY WITH A MEAL 180 tablet 1  atorvastatin (LIPITOR) 10 MG tablet Take 1 tablet (10 mg total) by mouth daily. 90 tablet 3   glipiZIDE (GLUCOTROL) 5 MG tablet Take 1 tablet (5 mg total) by mouth daily. 90 tablet 1   hydrOXYzine (ATARAX/VISTARIL) 10 MG tablet 1-2 tab po q 8 hours as needed itching 30 tablet 0   insulin aspart (NOVOLOG) 100 UNIT/ML injection Inject 5 Units into the skin 3 (three) times daily with meals. 3 vial PRN   meclizine (ANTIVERT) 12.5 MG tablet TAKE 1 TABLET BY MOUTH TWICE DAILY     trimethoprim-polymyxin b (POLYTRIM) ophthalmic solution Place 2 drops into the left eye every 6 (six) hours. 10 mL 0   COVID-19 mRNA vaccine, Pfizer, 30 MCG/0.3ML injection INJECT AS DIRECTED .3 mL 0   No facility-administered medications prior to visit.    Allergies  Allergen Reactions   Codeine     REACTION: Nausea \T\ Diarrhea    Review of Systems  Constitutional:  Negative for fever and malaise/fatigue.  HENT:   Negative for congestion.   Eyes:  Negative for blurred vision.  Respiratory:  Negative for shortness of breath.   Cardiovascular:  Negative for chest pain, palpitations and leg swelling.  Gastrointestinal:  Negative for abdominal pain, blood in stool and nausea.  Genitourinary:  Negative for dysuria and frequency.  Musculoskeletal:  Negative for falls.  Skin:  Positive for itching and rash.  Neurological:  Negative for dizziness, loss of consciousness and headaches.  Endo/Heme/Allergies:  Negative for environmental allergies.  Psychiatric/Behavioral:  Negative for depression. The patient is not nervous/anxious.       Objective:    Physical Exam Constitutional:      General: She is not in acute distress.    Appearance: She is well-developed.  HENT:     Head: Normocephalic and atraumatic.  Eyes:     Conjunctiva/sclera: Conjunctivae normal.  Neck:     Thyroid: No thyromegaly.  Cardiovascular:     Rate and Rhythm: Normal rate and regular rhythm.     Heart sounds: Normal heart sounds. No murmur heard. Pulmonary:     Effort: Pulmonary effort is normal. No respiratory distress.     Breath sounds: Normal breath sounds.  Abdominal:     General: Bowel sounds are normal. There is no distension.     Palpations: Abdomen is soft. There is no mass.     Tenderness: There is no abdominal tenderness.  Musculoskeletal:     Cervical back: Neck supple.  Lymphadenopathy:     Cervical: No cervical adenopathy.  Skin:    General: Skin is warm and dry.     Findings: Rash present.     Comments: Scattered macular lesions on her arms, erythematous  Neurological:     Mental Status: She is alert and oriented to person, place, and time.  Psychiatric:        Behavior: Behavior normal.    BP 116/68   Pulse 74   Temp 97.9 F (36.6 C)   Resp 16   Wt 186 lb (84.4 kg)   SpO2 99%   BMI 32.95 kg/m  Wt Readings from Last 3 Encounters:  06/05/21 186 lb (84.4 kg)  12/13/20 193 lb 8 oz (87.8 kg)   11/08/20 192 lb 6.4 oz (87.3 kg)    Diabetic Foot Exam - Simple   No data filed    Lab Results  Component Value Date   WBC 5.1 06/05/2021   HGB 13.5 06/05/2021   HCT 39.7 06/05/2021   PLT 238.0  06/05/2021   GLUCOSE 181 (H) 06/05/2021   CHOL 282 (H) 06/05/2021   TRIG 360.0 (H) 06/05/2021   HDL 59.60 06/05/2021   LDLDIRECT 165.0 06/05/2021   LDLCALC NOT CALC 06/24/2015   ALT 9 06/05/2021   AST 13 06/05/2021   NA 140 06/05/2021   K 4.4 06/05/2021   CL 100 06/05/2021   CREATININE 0.77 06/05/2021   BUN 16 06/05/2021   CO2 29 06/05/2021   TSH 0.82 06/05/2021   HGBA1C 7.7 (H) 06/05/2021   MICROALBUR <0.7 02/06/2018    Lab Results  Component Value Date   TSH 0.82 06/05/2021   Lab Results  Component Value Date   WBC 5.1 06/05/2021   HGB 13.5 06/05/2021   HCT 39.7 06/05/2021   MCV 90.1 06/05/2021   PLT 238.0 06/05/2021   Lab Results  Component Value Date   NA 140 06/05/2021   K 4.4 06/05/2021   CO2 29 06/05/2021   GLUCOSE 181 (H) 06/05/2021   BUN 16 06/05/2021   CREATININE 0.77 06/05/2021   BILITOT 0.5 06/05/2021   ALKPHOS 72 06/05/2021   AST 13 06/05/2021   ALT 9 06/05/2021   PROT 7.2 06/05/2021   ALBUMIN 4.3 06/05/2021   CALCIUM 9.8 06/05/2021   ANIONGAP 9 03/02/2019   GFR 78.43 06/05/2021   Lab Results  Component Value Date   CHOL 282 (H) 06/05/2021   Lab Results  Component Value Date   HDL 59.60 06/05/2021   Lab Results  Component Value Date   LDLCALC NOT CALC 06/24/2015   Lab Results  Component Value Date   TRIG 360.0 (H) 06/05/2021   Lab Results  Component Value Date   CHOLHDL 5 06/05/2021   Lab Results  Component Value Date   HGBA1C 7.7 (H) 06/05/2021       Assessment & Plan:   Problem List Items Addressed This Visit     Hypothyroidism - Primary    On Levothyroxine, continue to monitor       Relevant Orders   CBC (Completed)   TSH (Completed)   Diabetes mellitus type 2 in obese (HCC)    hgba1c acceptable, minimize  simple carbs. Increase exercise as tolerated. Continue current meds, she has been off of her Metformin for several weeks.        Relevant Orders   CBC (Completed)   Comprehensive metabolic panel (Completed)   Hemoglobin A1c (Completed)   Obesity    Encouraged DASH or MIND diet, decrease po intake and increase exercise as tolerated. Needs 7-8 hours of sleep nightly. Avoid trans fats, eat small, frequent meals every 4-5 hours with lean proteins, complex carbs and healthy fats. Minimize simple carbs, high fat foods and processed foods.        Relevant Orders   CBC (Completed)   Hyperlipidemia    She has been off ov the Atorvastatin for several months but she agrees to restart it. Encourage heart healthy diet such as MIND or DASH diet, increase exercise, avoid trans fats, simple carbohydrates and processed foods, consider a krill or fish or flaxseed oil cap daily.        Relevant Orders   CBC (Completed)   Lipid panel (Completed)   Contact dermatitis    Improving now but she has significant overgrowth in her yard. Dawn dishwashing liquid for after working in the yard. Cleanse the affected area with [place] after washing with dawn  Zyrtec/Cetirizine 10 mg up to twice a day and Bendryl 25 mg at bedtime  Pepcid/Famotidine 20 mg tab 1 tab twice a day whenever you are fighting poison ivy etc  Benadryl cream or steroid cream such cortaid and calamine        I have discontinued Jasmine Morse's glipiZIDE, hydrOXYzine, insulin aspart, and trimethoprim-polymyxin b. I am also having her maintain her meclizine, glucose blood, metFORMIN, lisinopril, Accu-Chek Guide, Ez Smart Blood Glucose Lancets, COVID-19 mRNA vaccine (Pfizer), COVID-19 mRNA vaccine [employer]), and levothyroxine.  No orders of the defined types were placed in this encounter.    Penni Homans, MD

## 2021-06-10 NOTE — Assessment & Plan Note (Signed)
hgba1c acceptable, minimize simple carbs. Increase exercise as tolerated. Continue current meds, she has been off of her Metformin for several weeks.

## 2021-06-10 NOTE — Assessment & Plan Note (Signed)
Improving now but she has significant overgrowth in her yard. Dawn dishwashing liquid for after working in the yard. Cleanse the affected area with [place] after washing with dawn  Zyrtec/Cetirizine 10 mg up to twice a day and Bendryl 25 mg at bedtime  Pepcid/Famotidine 20 mg tab 1 tab twice a day whenever you are fighting poison ivy etc  Benadryl cream or steroid cream such cortaid and calamine

## 2021-07-27 ENCOUNTER — Other Ambulatory Visit: Payer: Self-pay | Admitting: Family Medicine

## 2021-08-08 ENCOUNTER — Ambulatory Visit (INDEPENDENT_AMBULATORY_CARE_PROVIDER_SITE_OTHER): Payer: Medicare Other | Admitting: Medical

## 2021-08-08 ENCOUNTER — Other Ambulatory Visit: Payer: Self-pay

## 2021-08-08 VITALS — BP 131/57 | HR 75 | Resp 18 | Ht 63.0 in | Wt 190.0 lb

## 2021-08-08 DIAGNOSIS — I10 Essential (primary) hypertension: Secondary | ICD-10-CM

## 2021-08-08 DIAGNOSIS — E039 Hypothyroidism, unspecified: Secondary | ICD-10-CM | POA: Diagnosis not present

## 2021-08-08 DIAGNOSIS — E782 Mixed hyperlipidemia: Secondary | ICD-10-CM

## 2021-08-08 DIAGNOSIS — E669 Obesity, unspecified: Secondary | ICD-10-CM

## 2021-08-08 DIAGNOSIS — E1169 Type 2 diabetes mellitus with other specified complication: Secondary | ICD-10-CM

## 2021-08-08 NOTE — Patient Instructions (Addendum)
Bp well controlled. Continue lisinopril.  High cholesterol hx and last labs indicated elevated levels. Continue atorvastatin and eat low cholesterol diet.  For diabetes continue metformin. Get some regular exercise.  Hx of low thyroid. Continue levothyroxine.   Consider pcv 20 vaccine and shingles.   Signed/filled out form. But if your tb test + don't turn I form and let me know Health dept plan.  Follow up as regularly scheduled with pcp or sooner if needed.

## 2021-08-08 NOTE — Progress Notes (Signed)
Subjective:    Patient ID: Jasmine Morse, female    DOB: June 15, 1951, 70 y.o.   MRN: [id number]  HPI Pt in for evaluation. 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   Pt has hx of htn. Bp is well controlled.  Hx of low thyroid. Last tsh normal range. On levothyroxine 88 mcg daily.  High cholesterol in past. Elevated on last check 06/05/21.  Hx of diabetes. Last a1c was 7.7. Pt is on metformin.  Pt declines pcv 20 vaccine.  Pt never had shinlges vaccine. Advised can get thru pharmacy.    Review of Systems  Constitutional:  Negative for chills, fatigue and fever.  HENT:  Negative for congestion and drooling.   Respiratory:  Negative for cough, chest tightness, shortness of breath and wheezing.   Cardiovascular:  Negative for chest pain and palpitations.  Gastrointestinal:  Negative for abdominal pain.  Musculoskeletal:  Negative for back pain.  Skin:  Negative for rash.  Neurological:  Negative for seizures and light-headedness.  Hematological:  Negative for adenopathy. Does not bruise/bleed easily.  Psychiatric/Behavioral:  Negative for behavioral problems, decreased concentration and dysphoric mood.     Past Medical History:  Diagnosis Date   Allergic state 08/10/2014   Cancer (HCC)    colon cancer stage III (T3, N2b) s/p right hemicolectomy, FOLFOX-- Dr Learta Codding   Diabetes mellitus    type II   Hair loss 07/10/2015   Obesity, unspecified 08/10/2014   Preventative health care 09/09/2017   Thyroid disease    hypothyroidism     Social History   Socioeconomic History   Marital status: Widowed    Spouse name: Not on file   Number of children: 3   Years of education: Not on file   Highest education level: Not on file  Occupational History   Occupation: [employer]    Employer: [employer]  Tobacco Use   Smoking status: Never   Smokeless tobacco: Never  Substance and Sexual Activity   Alcohol use: No   Drug use: No   Sexual activity: Yes  Other Topics Concern    Not on file  Social History Narrative   Not on file   Social Determinants of Health   Financial Resource Strain: Not on file  Food Insecurity: Not on file  Transportation Needs: Not on file  Physical Activity: Not on file  Stress: Not on file  Social Connections: Not on file  Intimate Partner Violence: Not on file    Past Surgical History:  Procedure Laterality Date   CESAREAN SECTION     x 3   INNER EAR SURGERY     due to numerous ear infections   TONSILLECTOMY      Family History  Problem Relation Age of Onset   Hypertension Son    Cancer Father        colon   Cancer Brother        colon   Diabetes Other    Hypertension Other    Stroke Other     Allergies  Allergen Reactions   Codeine     REACTION: Nausea \T\ Diarrhea    Current Outpatient Medications on File Prior to Visit  Medication Sig Dispense Refill   atorvastatin (LIPITOR) 20 MG tablet Take 1 tablet (20 mg total) by mouth daily. 90 tablet 1   Blood Glucose Monitoring Suppl (ACCU-CHEK GUIDE) w/Device KIT 1 kit by Does not apply route as directed. 1 kit 0   COVID-19 mRNA vaccine, Pfizer, 30  MCG/0.3ML injection INJECT AS DIRECTED .3 mL 0   COVID-19 mRNA vaccine, Pfizer, 30 MCG/0.3ML injection INJECT AS DIRECTED .3 mL 0   Ez Smart Blood Glucose Lancets MISC 1 strip by Does not apply route daily. 100 each 12   glucose blood test strip Use as instructed 100 each 12   levothyroxine (SYNTHROID) 88 MCG tablet TAKE 1 TABLET BY MOUTH ONCE DAILY BEFOREBREAKFAST 90 tablet 1   lisinopril (ZESTRIL) 5 MG tablet TAKE 1 TABLET BY MOUTH ONCE DAILY 90 tablet 1   meclizine (ANTIVERT) 12.5 MG tablet Take 12.5 mg by mouth 2 (two) times daily.     metFORMIN (GLUCOPHAGE) 1000 MG tablet TAKE 1 TABLET BY MOUTH TWICE DAILY WITH A MEAL 180 tablet 1   No current facility-administered medications on file prior to visit.    BP (!) 131/57   Pulse 75   Resp 18   Ht 5' 3"  (1.6 m)   Wt 190 lb (86.2 kg)   SpO2 98%   BMI 33.66  kg/m       Objective:   Physical Exam  General- No acute distress. Pleasant patient. Neck- Full range of motion, no jvd Lungs- Clear, even and unlabored. Heart- regular rate and rhythm. Neurologic- CNII- XII grossly intact.       Assessment & Plan:  Bp well controlled. Continue lisinopril.  High cholesterol hx and last labs indicated elevated levels. Continue atorvastatin and eat low cholesterol diet.  For diabetes continue metformin. Get some regular exercise.  Hx of low thyroid. Continue levothyroxine.   Consider pcv 20 vaccine and shingles.   Signed/filled out form. But if your tb test + don't turn I form and let me know Health dept plan.  Follow up as regularly scheduled with pcp or sooner if needed.  Mackie Pai, PA-C

## 2021-08-28 ENCOUNTER — Telehealth: Payer: Self-pay | Admitting: Family Medicine

## 2021-08-28 NOTE — Telephone Encounter (Signed)
LVM for pt to rtn my call to [phone] to schedule AWV with [facility].

## 2021-12-28 ENCOUNTER — Other Ambulatory Visit: Payer: Self-pay | Admitting: Family Medicine

## 2021-12-28 DIAGNOSIS — E039 Hypothyroidism, unspecified: Secondary | ICD-10-CM

## 2021-12-28 DIAGNOSIS — E038 Other specified hypothyroidism: Secondary | ICD-10-CM

## 2021-12-28 DIAGNOSIS — T7840XA Allergy, unspecified, initial encounter: Secondary | ICD-10-CM

## 2021-12-28 DIAGNOSIS — E785 Hyperlipidemia, unspecified: Secondary | ICD-10-CM

## 2022-02-01 ENCOUNTER — Encounter: Payer: Self-pay | Admitting: Family Medicine

## 2022-02-07 ENCOUNTER — Encounter: Payer: Self-pay | Admitting: Medical

## 2022-02-07 ENCOUNTER — Ambulatory Visit (INDEPENDENT_AMBULATORY_CARE_PROVIDER_SITE_OTHER): Payer: Medicare PPO | Admitting: Medical

## 2022-02-07 VITALS — BP 130/70 | HR 69 | Resp 18 | Ht 63.0 in | Wt 190.0 lb

## 2022-02-07 DIAGNOSIS — E039 Hypothyroidism, unspecified: Secondary | ICD-10-CM | POA: Diagnosis not present

## 2022-02-07 DIAGNOSIS — Z13820 Encounter for screening for osteoporosis: Secondary | ICD-10-CM

## 2022-02-07 DIAGNOSIS — E785 Hyperlipidemia, unspecified: Secondary | ICD-10-CM

## 2022-02-07 DIAGNOSIS — E1169 Type 2 diabetes mellitus with other specified complication: Secondary | ICD-10-CM | POA: Diagnosis not present

## 2022-02-07 DIAGNOSIS — E2839 Other primary ovarian failure: Secondary | ICD-10-CM

## 2022-02-07 DIAGNOSIS — E669 Obesity, unspecified: Secondary | ICD-10-CM | POA: Diagnosis not present

## 2022-02-07 DIAGNOSIS — Z780 Asymptomatic menopausal state: Secondary | ICD-10-CM | POA: Diagnosis not present

## 2022-02-07 DIAGNOSIS — L989 Disorder of the skin and subcutaneous tissue, unspecified: Secondary | ICD-10-CM

## 2022-02-07 DIAGNOSIS — E119 Type 2 diabetes mellitus without complications: Secondary | ICD-10-CM

## 2022-02-07 LAB — HEMOGLOBIN A1C: Hgb A1c MFr Bld: 7.8 % — ABNORMAL HIGH (ref 4.6–6.5)

## 2022-02-07 MED ORDER — CEPHALEXIN 500 MG PO CAPS: 500.0000 mg | ORAL_CAPSULE | Freq: Two times a day (BID) | ORAL | 0 refills | Status: DC

## 2022-02-07 NOTE — Progress Notes (Signed)
Subjective:    Patient ID: Jasmine Morse, female    DOB: 11-24-1951, 71 y.o.   MRN: [id number]  HPI  Pt in for check up.  Htn- on lisinopril.   Hypothyroid- last tsh was normal.  High cholesterol- 8 month ago last cholesterol level was quite high. Pt is on atorvastatin.  Diabetes- last a1c was 7.7. Pt not on any diabetic medication. She states no side effects.  She had kidney function concerns based on something she read or heard from friend.  Declines mammogram.  Pt declines flu vaccine.   Raised area on rt 5t digit for about 7 months. Went to [facility]. Pt states got some medication and did help. Pt stats the area did flatten out with tx. She can't remember what was given.    Review of Systems  Constitutional:  Negative for chills, fatigue and fever.  Respiratory:  Negative for cough, chest tightness, shortness of breath and wheezing.   Cardiovascular:  Negative for chest pain and palpitations.  Gastrointestinal:  Negative for abdominal pain.  Genitourinary:  Negative for dyspareunia and dysuria.  Musculoskeletal:  Negative for back pain.       See hpi.  Skin:  Negative for rash.  Neurological:  Negative for dizziness, seizures, syncope, weakness, numbness and headaches.  Hematological:  Negative for adenopathy. Does not bruise/bleed easily.  Psychiatric/Behavioral:  Negative for behavioral problems and confusion. The patient is not nervous/anxious and is not hyperactive.     Past Medical History:  Diagnosis Date   Allergic state 08/10/2014   Cancer (HCC)    colon cancer stage III (T3, N2b) s/p right hemicolectomy, FOLFOX-- Dr Learta Codding   Diabetes mellitus    type II   Hair loss 07/10/2015   Obesity, unspecified 08/10/2014   Preventative health care 09/09/2017   Thyroid disease    hypothyroidism     Social History   Socioeconomic History   Marital status: Widowed    Spouse name: Not on file   Number of children: 3   Years of education: Not on file    Highest education level: Not on file  Occupational History   Occupation: [employer]    Employer: [employer]  Tobacco Use   Smoking status: Never   Smokeless tobacco: Never  Substance and Sexual Activity   Alcohol use: No   Drug use: No   Sexual activity: Yes  Other Topics Concern   Not on file  Social History Narrative   Not on file   Social Determinants of Health   Financial Resource Strain: Not on file  Food Insecurity: Not on file  Transportation Needs: Not on file  Physical Activity: Not on file  Stress: Not on file  Social Connections: Not on file  Intimate Partner Violence: Not on file    Past Surgical History:  Procedure Laterality Date   CESAREAN SECTION     x 3   INNER EAR SURGERY     due to numerous ear infections   TONSILLECTOMY      Family History  Problem Relation Age of Onset   Hypertension Son    Cancer Father        colon   Cancer Brother        colon   Diabetes Other    Hypertension Other    Stroke Other     Allergies  Allergen Reactions   Codeine     REACTION: Nausea \T\ Diarrhea    Current Outpatient Medications on File Prior  to Visit  Medication Sig Dispense Refill   atorvastatin (LIPITOR) 20 MG tablet Take 1 tablet (20 mg total) by mouth daily. 90 tablet 1   Blood Glucose Monitoring Suppl (ACCU-CHEK GUIDE) w/Device KIT 1 kit by Does not apply route as directed. 1 kit 0   Ez Smart Blood Glucose Lancets MISC 1 strip by Does not apply route daily. 100 each 12   glucose blood test strip Use as instructed 100 each 12   levothyroxine (SYNTHROID) 88 MCG tablet TAKE 1 TABLET BY MOUTH ONCE DAILY BEFOREBREAKFAST 90 tablet 1   lisinopril (ZESTRIL) 5 MG tablet TAKE 1 TABLET BY MOUTH ONCE DAILY 90 tablet 1   meclizine (ANTIVERT) 12.5 MG tablet Take 12.5 mg by mouth 2 (two) times daily.     metFORMIN (GLUCOPHAGE) 1000 MG tablet TAKE 1 TABLET BY MOUTH TWICE DAILY WITH A MEAL 180 tablet 1   No current facility-administered medications  on file prior to visit.    BP (!) 150/60    Pulse 69    Resp 18    Ht 5' 3"  (1.6 m)    Wt 190 lb (86.2 kg)    SpO2 100%    BMI 33.66 kg/m        Objective:   Physical Exam  General Mental Status- Alert. General Appearance- Not in acute distress.   Skin General: Color- Normal Color. Moisture- Normal Moisture.  Neck Carotid Arteries- Normal color. Moisture- Normal Moisture. No carotid bruits. No JVD.  Chest and Lung Exam Auscultation: Breath Sounds:-Normal.  Cardiovascular Auscultation:Rythm- Regular. Murmurs & Other Heart Sounds:Auscultation of the heart reveals- No Murmurs.  Abdomen Inspection:-Inspeection Normal. Palpation/Percussion:Note:No mass. Palpation and Percussion of the abdomen reveal- Non Tender, Non Distended + BS, no rebound or guarding.   Neurologic Cranial Nerve exam:- CN III-XII intact(No nystagmus), symmetric smile. Strength:- 5/5 equal and symmetric strength both upper and lower extremities.   Rt 5th digit- dorsal aspect of dip shows cricular raised area. Mild red. No flluctuance. No warmth. Faint tender. On inspection almost looks wart like.      Assessment & Plan:   Patient Instructions  Diabetes- will get a1c and cmp today. May refill metformin after result review. Rx advisement given.  Hyperlipidemia- lipid panel today. Continue metformin. May dose adjust if levels increased or persistent high. Adding zetia option as well.  Hypothyroid- will get tsh and t4. Adjust dose of med if necessary.  Placed order for dexascan.  Flu vaccine and pneumonia vaccine declined.  Mammogram declined.  For skin area on rt 5th digit have you start keflex antibiotic for possible skin infection pending referral to dermatologist.  Follow up date to be determined after lab review.    Mackie Pai, PA-C

## 2022-02-07 NOTE — Patient Instructions (Addendum)
Diabetes- will get a1c and cmp today. May refill metformin after result review. Rx advisement given.  Hyperlipidemia- lipid panel today. Continue metformin. May dose adjust if levels increased or persistent high. Adding zetia option as well.  Hypothyroid- will get tsh and t4. Adjust dose of med if necessary.  Placed order for dexascan.  Flu vaccine and pneumonia vaccine declined.  Mammogram declined.  For skin area on rt 5th digit have you start keflex antibiotic for possible skin infection pending referral to dermatologist.  Referral for diabetic eye exam placed.  Follow up date to be determined after lab review.

## 2022-02-08 LAB — COMPREHENSIVE METABOLIC PANEL
ALT: 10 U/L (ref 0–35)
AST: 16 U/L (ref 0–37)
Albumin: 4.2 g/dL (ref 3.5–5.2)
Alkaline Phosphatase: 63 U/L (ref 39–117)
BUN: 14 mg/dL (ref 6–23)
CO2: 33 mEq/L — ABNORMAL HIGH (ref 19–32)
Calcium: 9.6 mg/dL (ref 8.4–10.5)
Chloride: 102 mEq/L (ref 96–112)
Creatinine, Ser: 0.76 mg/dL (ref 0.40–1.20)
GFR: 79.3 mL/min (ref >60.00)
Glucose, Bld: 133 mg/dL — ABNORMAL HIGH (ref 70–99)
Potassium: 4.5 mEq/L (ref 3.5–5.1)
Sodium: 141 mEq/L (ref 135–145)
Total Bilirubin: 0.6 mg/dL (ref 0.2–1.2)
Total Protein: 7.2 g/dL (ref 6.0–8.3)

## 2022-02-08 LAB — LIPID PANEL
Cholesterol: 250 mg/dL — ABNORMAL HIGH (ref 0–200)
HDL: 65 mg/dL (ref >39.00)
LDL Cholesterol: 150 mg/dL — ABNORMAL HIGH (ref 0–99)
NonHDL: 185.42
Total CHOL/HDL Ratio: 4
Triglycerides: 177 mg/dL — ABNORMAL HIGH (ref 0.0–149.0)
VLDL: 35.4 mg/dL (ref 0.0–40.0)

## 2022-02-08 MED ORDER — SITAGLIPTIN PHOSPHATE 25 MG PO TABS: 25.0000 mg | ORAL_TABLET | Freq: Every day | ORAL | 3 refills | Status: DC

## 2022-02-08 NOTE — Addendum Note (Signed)
Addended by: Anabel Halon on: 02/08/2022 02:57 PM   Modules accepted: Orders

## 2022-02-13 ENCOUNTER — Other Ambulatory Visit: Payer: Self-pay

## 2022-02-13 ENCOUNTER — Ambulatory Visit (HOSPITAL_BASED_OUTPATIENT_CLINIC_OR_DEPARTMENT_OTHER)
Admission: RE | Admit: 2022-02-13 | Discharge: 2022-02-13 | Disposition: A | Discharge status: Home / Self Care | Payer: Medicare PPO | Source: Ambulatory Visit | Attending: Medical | Admitting: Medical

## 2022-02-13 DIAGNOSIS — Z780 Asymptomatic menopausal state: Secondary | ICD-10-CM | POA: Diagnosis present

## 2022-02-13 DIAGNOSIS — Z13820 Encounter for screening for osteoporosis: Secondary | ICD-10-CM | POA: Insufficient documentation

## 2022-02-13 DIAGNOSIS — E2839 Other primary ovarian failure: Secondary | ICD-10-CM | POA: Diagnosis present

## 2022-02-23 ENCOUNTER — Telehealth: Payer: Self-pay | Admitting: Family Medicine

## 2022-02-23 NOTE — Telephone Encounter (Signed)
Left message for patient to call back and schedule Medicare Annual Wellness Visit (AWV) in office.   If not able to come in office, please offer to do virtually or by telephone.  Left office number and my jabber [phone].  Last AWV:08/21/2019  Please schedule at anytime with Nurse Health Advisor.

## 2022-02-27 ENCOUNTER — Ambulatory Visit: Payer: Medicare PPO

## 2022-02-27 NOTE — Progress Notes (Deleted)
Subjective:   Jasmine Morse is a 71 y.o. female who presents for Medicare Annual (Subsequent) preventive examination. I connected with  Jasmine Morse on 02/27/22 by a audio enabled telemedicine application and verified that I am speaking with the correct person using two identifiers.  Patient Location: Home  Provider Location: Home Office  I discussed the limitations of evaluation and management by telemedicine. The patient expressed understanding and agreed to proceed.   Review of Systems    ***       Objective:    There were no vitals filed for this visit. There is no height or weight on file to calculate BMI.  Advanced Directives 01/05/2019  Does Patient Have a Medical Advance Directive? No  Would patient like information on creating a medical advance directive? No - Patient declined    Current Medications (verified) Outpatient Encounter Medications as of 02/27/2022  Medication Sig   atorvastatin (LIPITOR) 20 MG tablet Take 1 tablet (20 mg total) by mouth daily.   Blood Glucose Monitoring Suppl (ACCU-CHEK GUIDE) w/Device KIT 1 kit by Does not apply route as directed.   cephALEXin (KEFLEX) 500 MG capsule Take 1 capsule (500 mg total) by mouth 2 (two) times daily.   Ez Smart Blood Glucose Lancets MISC 1 strip by Does not apply route daily.   glucose blood test strip Use as instructed   levothyroxine (SYNTHROID) 88 MCG tablet TAKE 1 TABLET BY MOUTH ONCE DAILY BEFOREBREAKFAST   lisinopril (ZESTRIL) 5 MG tablet TAKE 1 TABLET BY MOUTH ONCE DAILY   meclizine (ANTIVERT) 12.5 MG tablet Take 12.5 mg by mouth 2 (two) times daily.   metFORMIN (GLUCOPHAGE) 1000 MG tablet TAKE 1 TABLET BY MOUTH TWICE DAILY WITH A MEAL   sitaGLIPtin (JANUVIA) 25 MG tablet Take 1 tablet (25 mg total) by mouth daily.   No facility-administered encounter medications on file as of 02/27/2022.    Allergies (verified) Codeine   History: Past Medical History:  Diagnosis Date   Allergic state 08/10/2014    Cancer (HCC)    colon cancer stage III (T3, N2b) s/p right hemicolectomy, FOLFOX-- Dr Learta Codding   Diabetes mellitus    type II   Hair loss 07/10/2015   Obesity, unspecified 08/10/2014   Preventative health care 09/09/2017   Thyroid disease    hypothyroidism   Past Surgical History:  Procedure Laterality Date   CESAREAN SECTION     x 3   INNER EAR SURGERY     due to numerous ear infections   TONSILLECTOMY     Family History  Problem Relation Age of Onset   Hypertension Son    Cancer Father        colon   Cancer Brother        colon   Diabetes Other    Hypertension Other    Stroke Other    Social History   Socioeconomic History   Marital status: Widowed    Spouse name: Not on file   Number of children: 3   Years of education: Not on file   Highest education level: Not on file  Occupational History   Occupation: [employer]    Employer: [employer]  Tobacco Use   Smoking status: Never   Smokeless tobacco: Never  Substance and Sexual Activity   Alcohol use: No   Drug use: No   Sexual activity: Yes  Other Topics Concern   Not on file  Social History Narrative   Not on file  Social Determinants of Health   Financial Resource Strain: Not on file  Food Insecurity: Not on file  Transportation Needs: Not on file  Physical Activity: Not on file  Stress: Not on file  Social Connections: Not on file    Tobacco Counseling Counseling given: Not Answered   Clinical Intake:                 Diabetic?yes Nutrition Risk Assessment:  Has the patient had any N/V/D within the last 2 months?  {YES/NO:21197} Does the patient have any non-healing wounds?  {YES/NO:21197} Has the patient had any unintentional weight loss or weight gain?  {YES/NO:21197}  Diabetes:  Is the patient diabetic?  {YES/NO:21197} If diabetic, was a CBG obtained today?  {YES/NO:21197} Did the patient bring in their glucometer from home?  {YES/NO:21197} How often do you  monitor your CBG's? ***.   Financial Strains and Diabetes Management:  Are you having any financial strains with the device, your supplies or your medication? {YES/NO:21197}.  Does the patient want to be seen by Chronic Care Management for management of their diabetes?  {YES/NO:21197} Would the patient like to be referred to a Nutritionist or for Diabetic Management?  {YES/NO:21197}  Diabetic Exams:  {Diabetic Eye Exam:2101801} {Diabetic Foot Exam:2101802}          Activities of Daily Living No flowsheet data found.  Patient Care Team: Mosie Lukes, MD as PCP - General (Family Medicine)  Indicate any recent Medical Services you may have received from other than [facility] providers in the past year (date may be approximate).     Assessment:   This is a routine wellness examination for [facility].  Hearing/Vision screen No results found.  Dietary issues and exercise activities discussed:     Goals Addressed   None    Depression Screen PHQ 2/9 Scores 11/08/2020 01/05/2019 05/09/2018  PHQ - 2 Score 0 0 0    Fall Risk Fall Risk  02/07/2022 11/08/2020 01/05/2019 05/09/2018  Falls in the past year? 0 0 0 No  Number falls in past yr: 0 0 - -  Injury with Fall? 0 0 - -  Follow up Falls evaluation completed - - -    FALL RISK PREVENTION PERTAINING TO THE HOME:  Any stairs in or around the home? {YES/NO:21197} If so, are there any without handrails? {YES/NO:21197} Home free of loose throw rugs in walkways, pet beds, electrical cords, etc? {YES/NO:21197} Adequate lighting in your home to reduce risk of falls? {YES/NO:21197}  ASSISTIVE DEVICES UTILIZED TO PREVENT FALLS:  Life alert? {YES/NO:21197} Use of a cane, walker or w/c? {YES/NO:21197} Grab bars in the bathroom? {YES/NO:21197} Shower chair or bench in shower? {YES/NO:21197} Elevated toilet seat or a handicapped toilet? {YES/NO:21197}  TIMED UP AND GO:  Was the test performed? {YES/NO:21197}.  Length of time to ambulate  10 feet: *** sec.   {Appearance of [id number]  Cognitive Function:        Immunizations Immunization History  Administered Date(s) Administered   Influenza Split 01/13/2013   Influenza Whole 11/01/2009   PFIZER(Purple Top)SARS-COV-2 Vaccination 03/11/2020, 04/05/2020, 12/13/2020   Pneumococcal Polysaccharide-23 11/01/2009    TDAP status: Up to date  {Flu Vaccine status:2101806}  Pneumococcal vaccine status: Due, Education has been provided regarding the importance of this vaccine. Advised may receive this vaccine at local pharmacy or Health Dept. Aware to provide a copy of the vaccination record if obtained from local pharmacy or Health Dept. Verbalized acceptance and understanding.  {Covid-19 vaccine status:2101808}  Qualifies for  Shingles Vaccine? {YES/NO:21197}  Zostavax completed {YES/NO:21197}  {Shingrix Completed?:2101804}  Screening Tests Health Maintenance  Topic Date Due   OPHTHALMOLOGY EXAM  Never done   Zoster Vaccines- Shingrix (1 of 2) Never done   FOOT EXAM  01/15/2017   COVID-19 Vaccine (4 - Booster for Pfizer series) 02/07/2021   INFLUENZA VACCINE  03/30/2022 (Originally 07/31/2021)   Pneumonia Vaccine 65+ Years old (2 - PCV) 02/07/2023 (Originally 11/01/2010)   MAMMOGRAM  02/07/2023 (Originally 01/15/2018)   COLONOSCOPY (Pts 45-11yr Insurance coverage will need to be confirmed)  04/02/2022   HEMOGLOBIN A1C  08/07/2022   TETANUS/TDAP  01/15/2026   DEXA SCAN  Completed   Hepatitis C Screening  Addressed   HPV VACCINES  Aged Out    Health Maintenance  Health Maintenance Due  Topic Date Due   OPHTHALMOLOGY EXAM  Never done   Zoster Vaccines- Shingrix (1 of 2) Never done   FOOT EXAM  01/15/2017   COVID-19 Vaccine (4 - Booster for PButlerseries) 02/07/2021    Colorectal cancer screening: Type of screening: Colonoscopy. Completed 04/02/2012. Repeat every 10 years  {Mammogram status:21018020}  Bone Density status: Completed 02/13/2022. Results  reflect: Bone density results: OSTEOPOROSIS. Repeat every 2 years.  Lung Cancer Screening: (Low Dose CT Chest recommended if Age 71-80years, 30 pack-year currently smoking OR have quit w/in 15years.) does not qualify.   Lung Cancer Screening Referral: N/A  Additional Screening:  Hepatitis C Screening: {DOES NOT does:27190::"does not"} qualify; Completed ***  Vision Screening: Recommended annual ophthalmology exams for early detection of glaucoma and other disorders of the eye. Is the patient up to date with their annual eye exam?  {YES/NO:21197} Who is the provider or what is the name of the office in which the patient attends annual eye exams? *** If pt is not established with a provider, would they like to be referred to a provider to establish care? {YES/NO:21197}.   Dental Screening: Recommended annual dental exams for proper oral hygiene  Community Resource Referral / Chronic Care Management: CRR required this visit?  {YES/NO:21197}  CCM required this visit?  {YES/NO:21197}     Plan:     I have personally reviewed and noted the following in the patients chart:   Medical and social history Use of alcohol, tobacco or illicit drugs  Current medications and supplements including opioid prescriptions.  Functional ability and status Nutritional status Physical activity Advanced directives List of other physicians Hospitalizations, surgeries, and ER visits in previous 12 months Vitals Screenings to include cognitive, depression, and falls Referrals and appointments  In addition, I have reviewed and discussed with patient certain preventive protocols, quality metrics, and best practice recommendations. A written personalized care plan for preventive services as well as general preventive health recommendations were provided to patient.     SDuard BradyChism, CMA   02/27/2022   Nurse Notes: ***

## 2022-03-20 ENCOUNTER — Telehealth: Payer: Self-pay | Admitting: Family Medicine

## 2022-03-20 NOTE — Telephone Encounter (Signed)
Left message for patient to call back and schedule Medicare Annual Wellness Visit (AWV) in office.  ? ?If not able to come in office, please offer to do virtually or by telephone.  Left office number and my jabber [phone]. ? ?Last AWV:01/05/2019 ? ?Please schedule at anytime with Nurse Health Advisor. ?  ?

## 2022-04-09 ENCOUNTER — Other Ambulatory Visit: Payer: Self-pay | Admitting: Family Medicine

## 2022-04-26 ENCOUNTER — Telehealth: Payer: Self-pay | Admitting: Family Medicine

## 2022-04-26 NOTE — Telephone Encounter (Signed)
Left message for patient to call back and schedule Medicare Annual Wellness Visit (AWV).  ? ?Please offer to do virtually or by telephone.  Left office number and my jabber [phone]. ? ?Last AWV:01/05/2019 ? ?Please schedule at anytime with Nurse Health Advisor. ?  ?

## 2022-05-04 ENCOUNTER — Telehealth: Payer: Self-pay | Admitting: Family Medicine

## 2022-05-04 NOTE — Telephone Encounter (Signed)
Left message for patient to call back and schedule Medicare Annual Wellness Visit (AWV).  ? ?Please offer to do virtually or by telephone.  Left office number and my jabber [phone]. ? ?Last AWV:01/05/2019 ? ?Please schedule at anytime with Nurse Health Advisor. ?  ?

## 2022-08-24 ENCOUNTER — Other Ambulatory Visit: Payer: Self-pay | Admitting: Family Medicine

## 2022-08-24 DIAGNOSIS — E039 Hypothyroidism, unspecified: Secondary | ICD-10-CM

## 2022-08-24 DIAGNOSIS — E785 Hyperlipidemia, unspecified: Secondary | ICD-10-CM

## 2022-08-24 DIAGNOSIS — T7840XA Allergy, unspecified, initial encounter: Secondary | ICD-10-CM

## 2022-08-24 DIAGNOSIS — E038 Other specified hypothyroidism: Secondary | ICD-10-CM

## 2022-09-02 DIAGNOSIS — J0110 Acute frontal sinusitis, unspecified: Secondary | ICD-10-CM | POA: Diagnosis not present

## 2022-09-02 DIAGNOSIS — E119 Type 2 diabetes mellitus without complications: Secondary | ICD-10-CM | POA: Diagnosis not present

## 2022-09-02 DIAGNOSIS — R42 Dizziness and giddiness: Secondary | ICD-10-CM | POA: Diagnosis not present

## 2022-09-02 DIAGNOSIS — I10 Essential (primary) hypertension: Secondary | ICD-10-CM | POA: Diagnosis not present

## 2022-09-02 DIAGNOSIS — Z6833 Body mass index (BMI) 33.0-33.9, adult: Secondary | ICD-10-CM | POA: Diagnosis not present

## 2022-11-20 ENCOUNTER — Telehealth: Payer: Self-pay | Admitting: Pharmacist

## 2022-11-20 DIAGNOSIS — E669 Obesity, unspecified: Secondary | ICD-10-CM

## 2022-11-20 DIAGNOSIS — E785 Hyperlipidemia, unspecified: Secondary | ICD-10-CM

## 2022-11-20 DIAGNOSIS — E038 Other specified hypothyroidism: Secondary | ICD-10-CM

## 2022-11-20 DIAGNOSIS — T7840XA Allergy, unspecified, initial encounter: Secondary | ICD-10-CM

## 2022-11-20 MED ORDER — ATORVASTATIN CALCIUM 20 MG PO TABS: 20.0000 mg | ORAL_TABLET | Freq: Every day | ORAL | 1 refills | Status: DC

## 2022-11-20 MED ORDER — SITAGLIPTIN PHOSPHATE 100 MG PO TABS
100.0000 mg | ORAL_TABLET | Freq: Every day | ORAL | 3 refills | Status: DC
Start: 2022-11-20 — End: 2023-06-21

## 2022-11-20 MED ORDER — METFORMIN HCL 1000 MG PO TABS: 1000.0000 mg | ORAL_TABLET | Freq: Two times a day (BID) | ORAL | 1 refills | Status: DC

## 2022-11-20 MED ORDER — LISINOPRIL 5 MG PO TABS: 5.0000 mg | ORAL_TABLET | Freq: Every day | ORAL | 0 refills | Status: DC

## 2022-11-20 NOTE — Telephone Encounter (Signed)
Patient was noted to have low adherence to Januvia and metformin. Also has not filled statin in 2023. Patient called and discussed medications. She was not sure why she stopped statin. Discussed that last LDL was 150. She is agreeable to restarting atorvastatin and updated Rx was sent to her pharmacy.  She also was noted to need updated Rxs for metformin and Januvia. Patient is only taking '25mg'$  of Januvia. Last BMET showed that she has normal renal function. Increased Januvia to '100mg'$  daily which is usual dose.  Patient also needs to rescheduled 2024 physical with Dr Charlett Blake. Forwarded Teams message to [facility] scheduler for Dr Charlett Blake.

## 2022-11-20 NOTE — Telephone Encounter (Signed)
Patient also needed updated Rx for lisinopril '5mg'$  - sent to pharmacy

## 2022-11-20 NOTE — Addendum Note (Signed)
Addended by: Cherre Robins B on: 11/20/2022 09:48 AM   Modules accepted: Orders

## 2022-12-05 NOTE — Telephone Encounter (Signed)
Left message on patient's VM that prescriptions were available for pick up at her pharmacy. Patient can contact me if there are any issues with cost.  Also LM with date of next visit with PCP - 02/11/2023 at 1:20pm

## 2022-12-05 NOTE — Telephone Encounter (Signed)
Patient has not picked up prescriptions yet form [facility].

## 2023-01-10 ENCOUNTER — Telehealth: Payer: Self-pay | Admitting: Family Medicine

## 2023-01-10 NOTE — Telephone Encounter (Signed)
Copied from [place] [id number]. Topic: Medicare AWV >> Jan 10, 2023 10:24 AM Gillis Santa wrote: Reason for CRM: LVM PATIENT TO CALL [phone] SCHEDULE AWVS [place]

## 2023-02-11 ENCOUNTER — Encounter: Payer: Medicare PPO | Admitting: Family Medicine

## 2023-02-12 ENCOUNTER — Other Ambulatory Visit (INDEPENDENT_AMBULATORY_CARE_PROVIDER_SITE_OTHER): Payer: Medicare PPO | Admitting: Pharmacist

## 2023-02-12 ENCOUNTER — Telehealth: Payer: Self-pay | Admitting: Pharmacist

## 2023-02-12 DIAGNOSIS — E1169 Type 2 diabetes mellitus with other specified complication: Secondary | ICD-10-CM

## 2023-02-12 DIAGNOSIS — E669 Obesity, unspecified: Secondary | ICD-10-CM

## 2023-02-12 DIAGNOSIS — E785 Hyperlipidemia, unspecified: Secondary | ICD-10-CM

## 2023-02-12 NOTE — Progress Notes (Signed)
   02/12/2023 Name: Jasmine Morse MRN: [id number] DOB: May 16, 1951  Chief Complaint  Patient presents with   Diabetes    Follow up   Medication Management    Follow up adherence     Outreached patient by phone to discuss medication adherence and diabetes.   Also noted that patient needs to reschedule appt with PCP since provider was out of office 04/02/2023  Care Team: Primary Care Provider: Mosie Lukes, MD   Medication Access/Adherence  Current Pharmacy:  [facility], [place] - [address] [place] [address] [place] [address] Phone: [phone] Fax: [phone]   Patient reports affordability concerns with their medications: No  Patient reports access/transportation concerns to their pharmacy: No  Patient reports adherence concerns with their medications:  Yes  doesn't always remember the 2nd dose of metformin Patient states she lives half the week away from home - cares for her infant granddaughter.   Patient had low adherence score for 2023 for hypertension, DM, and hyperlipidemia medications.  Patient is not checking blood pressure or blood glucose at home. States insurance required her to get a new glucometer and even after pharmacy showed her how to use she has had difficulty.   Objective:  BP Readings from Last 3 Encounters:  02/07/22 130/70  08/08/21 (!) 131/57  06/05/21 116/68    Lab Results  Component Value Date   HGBA1C 7.8 (H) 02/07/2022    Lab Results  Component Value Date   CREATININE 0.76 02/07/2022   BUN 14 02/07/2022   NA 141 02/07/2022   K 4.5 02/07/2022   CL 102 02/07/2022   CO2 33 (H) 02/07/2022    Lab Results  Component Value Date   CHOL 250 (H) 02/07/2022   HDL 65.00 02/07/2022   LDLCALC 150 (H) 02/07/2022   LDLDIRECT 165.0 06/05/2021   TRIG 177.0 (H) 02/07/2022   CHOLHDL 4 02/07/2022      Assessment/Plan:   Diabetes: - Encouraged patient to restart metformin TWICE a day with meal.  Recommended she set alarm to remind her to take.  - Assisted with requesting refill for Januvia.  - Patient to bring in glucometer to next visit so we can show her how to use.     Hypertension: - Currently controlled - Recommend to continue current medications for blood pressure lowering / renal protection    Hyperlipidemia/ASCVD Risk Reduction: - Currently uncontrolled.  - Reviewed long term complications of uncontrolled cholesterol - Recommend to continue atorvastatin (was not taking regularly when she last had lipids checked)    Medication Management: - Currently strategy insufficient to maintain appropriate adherence to prescribed medication regimen - Suggested use of weekly pill box to organize medications - Assisted with requesting RF for Januvia. Patient endorsed she had plenty of other maintenance medications.    Follow Up Plan: check back in 1 month to assess adherence  Appointment made with AP 04/02/2023 fro physical (Dr Frederik Pear schedule was full)  Cherre Robins, PharmD Clinical Pharmacist [facility]

## 2023-02-12 NOTE — Telephone Encounter (Signed)
Outreached patient by phone to discuss medication adherence and diabetes.   Also noted that patient needs to reschedule appt with PCP since provider was out of office 04/02/2023  Care Team: Primary Care Provider: Mosie Lukes, MD   Medication Access/Adherence  Current Pharmacy:  [facility], [place] - [address] [place] [address] [place] [address] Phone: [phone] Fax: [phone]   Patient reports affordability concerns with their medications: No  Patient reports access/transportation concerns to their pharmacy: No  Patient reports adherence concerns with their medications:  Yes  doesn't always remember the 2nd dose of metformin Patient states she lives half the week away from home - cares for her infant granddaughter.   Patient had low adherence score for 2023 for hypertension, DM, and hyperlipidemia medications.  Patient is not checking blood pressure or blood glucose at home. States insurance required her to get a new glucometer and even after pharmacy showed her how to use she has had difficulty.   Objective:  BP Readings from Last 3 Encounters:  02/07/22 130/70  08/08/21 (!) 131/57  06/05/21 116/68    Lab Results  Component Value Date   HGBA1C 7.8 (H) 02/07/2022    Lab Results  Component Value Date   CREATININE 0.76 02/07/2022   BUN 14 02/07/2022   NA 141 02/07/2022   K 4.5 02/07/2022   CL 102 02/07/2022   CO2 33 (H) 02/07/2022    Lab Results  Component Value Date   CHOL 250 (H) 02/07/2022   HDL 65.00 02/07/2022   LDLCALC 150 (H) 02/07/2022   LDLDIRECT 165.0 06/05/2021   TRIG 177.0 (H) 02/07/2022   CHOLHDL 4 02/07/2022      Assessment/Plan:   Diabetes: - Encouraged patient to restart metformin TWICE a day with meal. Recommended she set alarm to remind her to take.  - Assisted with requesting refill for Januvia.  - Patient to bring in glucometer to next visit so we can show her how to use.   Appointment made  with AP 04/02/2023 because Dr Frederik Pear schedule was full.   Hypertension: - Currently controlled - Recommend to continue current medications for blood pressure lowering / renal protection    Hyperlipidemia/ASCVD Risk Reduction: - Currently uncontrolled.  - Reviewed long term complications of uncontrolled cholesterol - Recommend to continue atorvastatin (was not taking regularly when she last had lipids checked)    Medication Management: - Currently strategy insufficient to maintain appropriate adherence to prescribed medication regimen - Suggested use of weekly pill box to organize medications - Assisted with requesting RF for Januvia. Patient endorsed she had plenty of other maintenance medications.    Follow Up Plan: check back in 1 month to assess adherence  Cherre Robins, PharmD Clinical Pharmacist [facility]

## 2023-03-19 ENCOUNTER — Other Ambulatory Visit: Payer: Medicare PPO | Admitting: Pharmacist

## 2023-03-19 ENCOUNTER — Telehealth: Payer: Self-pay | Admitting: Pharmacist

## 2023-03-19 NOTE — Telephone Encounter (Signed)
Unsuccessful outreach today to patient to follow up med adherence and home blood glucose readings / Diabetes.  Unable to reach patient - LM on VM with CB# [phone] or [phone]

## 2023-03-22 ENCOUNTER — Other Ambulatory Visit: Payer: Medicare PPO | Admitting: Pharmacist

## 2023-03-22 DIAGNOSIS — E669 Obesity, unspecified: Secondary | ICD-10-CM

## 2023-03-22 DIAGNOSIS — E785 Hyperlipidemia, unspecified: Secondary | ICD-10-CM

## 2023-03-22 NOTE — Progress Notes (Signed)
   03/22/2023 Name: Jasmine Morse MRN: [id number] DOB: [birth year]  Chief Complaint  Patient presents with   Medication Management   Diabetes   Hypertension   Hyperlipidemia     Outreached patient by phone to discuss medication adherence and chronic conditions   Care Team: Primary Care Provider: Mosie Lukes, MD - will see Caleen Jobs, NP 04/02/2023  Medication Access/Adherence  Current Pharmacy:  [facility], [place] - [address] [place] [address] [place] [address] Phone: [phone] Fax: [phone]   Patient reports affordability concerns with their medications: Cost Januvia a little high but most months is affordable.  Patient reports access/transportation concerns to their pharmacy: No  Patient reports adherence concerns with their medications:  Yes  - she stopped metformin due to concerns with potential side effects.   Patient states she lives half the week away from home - cares for her infant granddaughter.   Patient had low adherence score for 2023 for hypertension, DM, and hyperlipidemia medications.  Patient is not checking blood pressure or blood glucose at home. States insurance required her to get a new glucometer and even after pharmacy showed her how to use she has had difficulty.   Objective:  BP Readings from Last 3 Encounters:  02/07/22 130/70  08/08/21 (!) 131/57  06/05/21 116/68    Lab Results  Component Value Date   HGBA1C 7.8 (H) 02/07/2022    Lab Results  Component Value Date   CREATININE 0.76 02/07/2022   BUN 14 02/07/2022   NA 141 02/07/2022   K 4.5 02/07/2022   CL 102 02/07/2022   CO2 33 (H) 02/07/2022    Lab Results  Component Value Date   CHOL 250 (H) 02/07/2022   HDL 65.00 02/07/2022   LDLCALC 150 (H) 02/07/2022   LDLDIRECT 165.0 06/05/2021   TRIG 177.0 (H) 02/07/2022   CHOLHDL 4 02/07/2022      Assessment/Plan:   Diabetes: - Discussed potential side effects of metformin - most  patient's are able to tolerate if they take with food. Encouraged patient to restart metformin TWICE a day with meal. Recommended she set alarm to remind her to take.  - Continue Januvia 100mg  daily (patient asked about change in dose form 25 to 100mg  daily. Lower doses of januvia are used in patient with decreased renal function but her last Scr was OK and no dose adjustment is needed.  - Patient to bring in glucometer to next visit so either myself or CMA can show her how to use.   Hypertension: - Currently controlled - Recommend to continue current medications for blood pressure lowering / renal protection    Hyperlipidemia/ASCVD Risk Reduction: - Currently uncontrolled.  - Reviewed long term complications of uncontrolled cholesterol - Recommend to continue atorvastatin (was not taking regularly when she last had lipids checked)    Medication Management: - Currently strategy insufficient to maintain appropriate adherence to prescribed medication regimen - Suggested use of weekly pill box to organize medications -  Mailing Januvia / [employer] medication assistance program application. Patient to bring to office when she is her 04/02/2023   Follow Up Plan: check back in 1 month to assess adherence   Cherre Robins, PharmD Clinical Pharmacist [facility]

## 2023-04-01 DIAGNOSIS — K5730 Diverticulosis of large intestine without perforation or abscess without bleeding: Secondary | ICD-10-CM | POA: Insufficient documentation

## 2023-04-01 DIAGNOSIS — K5900 Constipation, unspecified: Secondary | ICD-10-CM | POA: Insufficient documentation

## 2023-04-01 DIAGNOSIS — Z800 Family history of malignant neoplasm of digestive organs: Secondary | ICD-10-CM | POA: Insufficient documentation

## 2023-04-01 DIAGNOSIS — Z85038 Personal history of other malignant neoplasm of large intestine: Secondary | ICD-10-CM | POA: Insufficient documentation

## 2023-04-01 NOTE — Progress Notes (Unsigned)
Complete physical exam  Patient: Jasmine Morse   DOB: 24-May-1951   72 y.o. Female  MRN: [id number]  Subjective:    No chief complaint on file.   Jasmine Morse is a 72 y.o. female who presents today for a complete physical exam. She reports consuming a {diet types:17450} diet. {types:19826} She generally feels {DESC; WELL/FAIRLY WELL/POORLY:18703}. She reports sleeping {DESC; WELL/FAIRLY WELL/POORLY:18703}. She {does/does not:200015} have additional problems to discuss today.   Hypothyroidism: *** Currently taking levothyroxine 88 mcg daily.  DM2 and Obesity: *** Currently taking lisinopril, metformin***, Januvia. Last A1c 7.8% February 2023. Hyperlipidemia: *** Currently taking Lipitor.     Most recent fall risk assessment:    02/07/2022    8:11 AM  Fall Risk   Falls in the past year? 0  Number falls in past yr: 0  Injury with Fall? 0  Follow up Falls evaluation completed     Most recent depression screenings:    11/08/2020    3:01 PM 01/05/2019    1:18 PM  PHQ 2/9 Scores  PHQ - 2 Score 0 0    {VISON DENTAL STD PSA (Optional):27386}  {History (Optional):23778}  Patient Care Team: Mosie Lukes, MD as PCP - General (Family Medicine)   Outpatient Medications Prior to Visit  Medication Sig   atorvastatin (LIPITOR) 20 MG tablet Take 1 tablet (20 mg total) by mouth daily. For cholesterol   Blood Glucose Monitoring Suppl (ACCU-CHEK GUIDE) w/Device KIT 1 kit by Does not apply route as directed.   Ez Smart Blood Glucose Lancets MISC 1 strip by Does not apply route daily.   glucose blood test strip Use as instructed   levothyroxine (SYNTHROID) 88 MCG tablet TAKE 1 TABLET BY MOUTH ONCE DAILY BEFOREBREAKFAST   lisinopril (ZESTRIL) 5 MG tablet Take 1 tablet (5 mg total) by mouth daily.   meclizine (ANTIVERT) 12.5 MG tablet Take 12.5 mg by mouth 2 (two) times daily.   metFORMIN (GLUCOPHAGE) 1000 MG tablet Take 1 tablet (1,000 mg total) by mouth 2 (two) times daily with a meal.  (Patient not taking: Reported on 03/22/2023)   sitaGLIPtin (JANUVIA) 100 MG tablet Take 1 tablet (100 mg total) by mouth daily.   No facility-administered medications prior to visit.    ROS        Objective:     There were no vitals taken for this visit. {Vitals History (Optional):23777}  Physical Exam   No results found for any visits on 04/02/23. {Show previous labs (optional):23779}    Assessment & Plan:    Routine Health Maintenance and Physical Exam  Immunization History  Administered Date(s) Administered   Influenza Split 01/13/2013   Influenza Whole 11/01/2009   Influenza, High Dose Seasonal PF 03/03/2019   PFIZER(Purple Top)SARS-COV-2 Vaccination 03/11/2020, 04/05/2020, 12/13/2020   Pneumococcal Polysaccharide-23 11/01/2009   Tdap 12/13/2021    Health Maintenance  Topic Date Due   OPHTHALMOLOGY EXAM  Never done   Zoster Vaccines- Shingrix (1 of 2) Never done   Pneumonia Vaccine 9+ Years old (2 of 2 - PCV) 07/07/2016   FOOT EXAM  01/15/2017   MAMMOGRAM  01/15/2018   Diabetic kidney evaluation - Urine ACR  02/06/2019   Medicare Annual Wellness (AWV)  01/06/2020   COLONOSCOPY (Pts 45-14yrs Insurance coverage will need to be confirmed)  04/02/2022   HEMOGLOBIN A1C  08/07/2022   COVID-19 Vaccine (4 - 2023-24 season) 08/31/2022   Diabetic kidney evaluation - eGFR measurement  02/07/2023   INFLUENZA VACCINE  08/01/2023  DTaP/Tdap/Td (2 - Td or Tdap) 12/14/2031   DEXA SCAN  Completed   Hepatitis C Screening  Addressed   HPV VACCINES  Aged Out    Discussed health benefits of physical activity, and encouraged her to engage in regular exercise appropriate for her age and condition.  Problem List Items Addressed This Visit     Hypothyroidism   Hyperlipidemia - Primary   Preventative health care   Other Visit Diagnoses     Hypertension, unspecified type       Type 2 diabetes mellitus without complication, without long-term current use of insulin           No follow-ups on file.     Terrilyn Saver, NP

## 2023-04-02 ENCOUNTER — Encounter: Payer: Self-pay | Admitting: Family Medicine

## 2023-04-02 ENCOUNTER — Ambulatory Visit (INDEPENDENT_AMBULATORY_CARE_PROVIDER_SITE_OTHER): Payer: Medicare PPO | Admitting: Family Medicine

## 2023-04-02 ENCOUNTER — Telehealth: Payer: Self-pay | Admitting: Family Medicine

## 2023-04-02 VITALS — BP 134/73 | HR 66 | Ht 63.0 in | Wt 192.0 lb

## 2023-04-02 DIAGNOSIS — E782 Mixed hyperlipidemia: Secondary | ICD-10-CM

## 2023-04-02 DIAGNOSIS — I10 Essential (primary) hypertension: Secondary | ICD-10-CM | POA: Diagnosis not present

## 2023-04-02 DIAGNOSIS — Z0000 Encounter for general adult medical examination without abnormal findings: Secondary | ICD-10-CM | POA: Diagnosis not present

## 2023-04-02 DIAGNOSIS — Z1211 Encounter for screening for malignant neoplasm of colon: Secondary | ICD-10-CM | POA: Diagnosis not present

## 2023-04-02 DIAGNOSIS — E039 Hypothyroidism, unspecified: Secondary | ICD-10-CM

## 2023-04-02 DIAGNOSIS — E119 Type 2 diabetes mellitus without complications: Secondary | ICD-10-CM

## 2023-04-02 LAB — CBC WITH DIFFERENTIAL/PLATELET
Basophils Absolute: 0 10*3/uL (ref 0.0–0.1)
Basophils Relative: 0.7 % (ref 0.0–3.0)
Eosinophils Absolute: 0.1 10*3/uL (ref 0.0–0.7)
Eosinophils Relative: 1.3 % (ref 0.0–5.0)
HCT: 38.7 % (ref 36.0–46.0)
Hemoglobin: 13.2 g/dL (ref 12.0–15.0)
Lymphocytes Relative: 20.9 % (ref 12.0–46.0)
Lymphs Abs: 1.3 10*3/uL (ref 0.7–4.0)
MCHC: 34.1 g/dL (ref 30.0–36.0)
MCV: 90.3 fl (ref 78.0–100.0)
Monocytes Absolute: 0.5 10*3/uL (ref 0.1–1.0)
Monocytes Relative: 7.6 % (ref 3.0–12.0)
Neutro Abs: 4.2 10*3/uL (ref 1.4–7.7)
Neutrophils Relative %: 69.5 % (ref 43.0–77.0)
Platelets: 231 10*3/uL (ref 150.0–400.0)
RBC: 4.29 Mil/uL (ref 3.87–5.11)
RDW: 13.4 % (ref 11.5–15.5)
WBC: 6.1 10*3/uL (ref 4.0–10.5)

## 2023-04-02 LAB — MICROALBUMIN / CREATININE URINE RATIO
Creatinine,U: 56.5 mg/dL
Microalb Creat Ratio: 1.2 mg/g (ref 0.0–30.0)
Microalb, Ur: <0.7 mg/dL (ref 0.0–1.9)

## 2023-04-02 LAB — TSH: TSH: 2.57 u[IU]/mL (ref 0.35–5.50)

## 2023-04-02 LAB — HEMOGLOBIN A1C: Hgb A1c MFr Bld: 7.7 % — ABNORMAL HIGH (ref 4.6–6.5)

## 2023-04-02 NOTE — Assessment & Plan Note (Signed)
Previously well controlled Continue Synthroid at current dose  Recheck TSH and adjust Synthroid as indicated   

## 2023-04-02 NOTE — Telephone Encounter (Signed)
Pt came into office to drop off MAP paperwork for Tammy along with a copy of proof of income. Paperwork was placed in Tammy's box.

## 2023-04-02 NOTE — Assessment & Plan Note (Signed)
Well controlled with last A1c <8% Continue current medications UTD on vaccines, eye exam, foot exam On ACEi  On Statin  Discussed diet and exercise F/u in 6 months

## 2023-04-02 NOTE — Assessment & Plan Note (Signed)
-  Reviewed most recent lipid panel -Medication management: continue Lipitor -Repeat CMP and lipid panel today -Diet low in saturated fat -Regular exercise - at least 30 minutes, 5 times per week  

## 2023-04-03 LAB — LIPID PANEL W/REFLEX DIRECT LDL
Cholesterol: 183 mg/dL (ref <200)
HDL: 72 mg/dL (ref >=50)
LDL Cholesterol (Calc): 85 mg/dL (calc)
Non-HDL Cholesterol (Calc): 111 mg/dL (calc) (ref <130)
Total CHOL/HDL Ratio: 2.5 (calc) (ref <5.0)
Triglycerides: 164 mg/dL — ABNORMAL HIGH (ref <150)

## 2023-04-03 LAB — COMPLETE METABOLIC PANEL WITH GFR
AG Ratio: 1.5 (calc) (ref 1.0–2.5)
ALT: 12 U/L (ref 6–29)
AST: 16 U/L (ref 10–35)
Albumin: 4.4 g/dL (ref 3.6–5.1)
Alkaline phosphatase (APISO): 60 U/L (ref 37–153)
BUN: 17 mg/dL (ref 7–25)
CO2: 27 mmol/L (ref 20–32)
Calcium: 9.2 mg/dL (ref 8.6–10.4)
Chloride: 101 mmol/L (ref 98–110)
Creat: 0.79 mg/dL (ref 0.60–1.00)
Globulin: 2.9 g/dL (calc) (ref 1.9–3.7)
Glucose, Bld: 145 mg/dL — ABNORMAL HIGH (ref 65–99)
Potassium: 4.4 mmol/L (ref 3.5–5.3)
Sodium: 139 mmol/L (ref 135–146)
Total Bilirubin: 0.5 mg/dL (ref 0.2–1.2)
Total Protein: 7.3 g/dL (ref 6.1–8.1)
eGFR: 80 mL/min/{1.73_m2} (ref >=60)

## 2023-04-04 ENCOUNTER — Encounter: Payer: Self-pay | Admitting: Family Medicine

## 2023-04-04 NOTE — Telephone Encounter (Signed)
Reviewed application and completed provider portions. Forwarded to PCP to review and sign.  Once returned will mail to [employer] medication assistance program.

## 2023-04-04 NOTE — Telephone Encounter (Signed)
PCP signed medication assistance program. Made copy of application and mailed to [employer].

## 2023-05-22 ENCOUNTER — Other Ambulatory Visit: Payer: Self-pay | Admitting: Family Medicine

## 2023-05-22 DIAGNOSIS — E785 Hyperlipidemia, unspecified: Secondary | ICD-10-CM

## 2023-05-22 DIAGNOSIS — E038 Other specified hypothyroidism: Secondary | ICD-10-CM

## 2023-05-22 DIAGNOSIS — T7840XA Allergy, unspecified, initial encounter: Secondary | ICD-10-CM

## 2023-05-22 DIAGNOSIS — E039 Hypothyroidism, unspecified: Secondary | ICD-10-CM

## 2023-05-28 ENCOUNTER — Telehealth: Payer: Self-pay | Admitting: Pharmacist

## 2023-05-28 NOTE — Telephone Encounter (Signed)
Called [employer] patient assistance to check on application for Januvia medication assistance program. Per [employer] medication assistance program they mailed application back to patient with attestation 04/24/2023. Medication assistance program states there is a missing signature for patient and provider.   Spoke with patient - she is out of town currently but she does have application at home and will call when she is back at home to discuss what additional information is needed for application.

## 2023-06-21 ENCOUNTER — Other Ambulatory Visit: Payer: Self-pay | Admitting: Family Medicine

## 2023-07-18 ENCOUNTER — Other Ambulatory Visit: Payer: Self-pay | Admitting: Family Medicine

## 2023-08-23 ENCOUNTER — Other Ambulatory Visit: Payer: Self-pay | Admitting: Family Medicine

## 2023-10-03 ENCOUNTER — Ambulatory Visit: Payer: Medicare PPO | Admitting: Family Medicine

## 2023-10-03 ENCOUNTER — Telehealth: Payer: Medicare PPO | Admitting: Family Medicine

## 2023-12-01 NOTE — Assessment & Plan Note (Signed)
hgba1c acceptable, minimize simple carbs. Increase exercise as tolerated. Continue current meds 

## 2023-12-01 NOTE — Assessment & Plan Note (Signed)
She has been off ov the Atorvastatin for several months but she agrees to restart it. Encourage heart healthy diet such as MIND or DASH diet, increase exercise, avoid trans fats, simple carbohydrates and processed foods, consider a krill or fish or flaxseed oil cap daily.

## 2023-12-01 NOTE — Assessment & Plan Note (Signed)
On Levothyroxine, continue to monitor 

## 2023-12-03 ENCOUNTER — Ambulatory Visit: Payer: Medicare PPO | Admitting: Family Medicine

## 2023-12-03 VITALS — BP 122/82 | HR 77 | Temp 97.6°F | Resp 18 | Ht 62.0 in | Wt 193.8 lb

## 2023-12-03 DIAGNOSIS — R519 Headache, unspecified: Secondary | ICD-10-CM

## 2023-12-03 DIAGNOSIS — E785 Hyperlipidemia, unspecified: Secondary | ICD-10-CM

## 2023-12-03 DIAGNOSIS — E669 Obesity, unspecified: Secondary | ICD-10-CM | POA: Diagnosis not present

## 2023-12-03 DIAGNOSIS — E039 Hypothyroidism, unspecified: Secondary | ICD-10-CM

## 2023-12-03 DIAGNOSIS — E1169 Type 2 diabetes mellitus with other specified complication: Secondary | ICD-10-CM

## 2023-12-03 NOTE — Patient Instructions (Addendum)
Consider daily allergy meds such as Claritin/Loratadine or Zyrtec/Cetirizine daily and some daily nose spray like Flonase/Fluticasone for a month and see if it helps   Prevnar 20 once at pharmacy  Annual covid and flu shots  Shingrix is the new shingles shot, 2 shots over 2-6 months, confirm coverage with insurance and document, then can return here for shots with nurse appt or at pharmacy   RSV, Respiratory Syncitial Virus Vaccine, Arexvy at pharmacy  General Headache Without Cause A headache is pain or discomfort felt around the head or neck area. There are many causes and types of headaches. A few common types include: Tension headaches. Migraine headaches. Cluster headaches. Chronic daily headaches. Sometimes, the specific cause of a headache may not be found. Follow these instructions at home: Watch your condition for any changes. Let your health care provider know about them. Take these steps to help with your condition: Managing pain     Take over-the-counter and prescription medicines only as told by your health care provider. Treatment may include medicines for pain that are taken by mouth or applied to the skin. Lie down in a dark, quiet room when you have a headache. Keep lights dim if bright lights bother you or make your headaches worse. If directed, put ice on your head and neck area: Put ice in a plastic bag. Place a towel between your skin and the bag. Leave the ice on for 20 minutes, 2-3 times per day. Remove the ice if your skin turns bright red. This is very important. If you cannot feel pain, heat, or cold, you have a greater risk of damage to the area. If directed, apply heat to the affected area. Use the heat source that your health care provider recommends, such as a moist heat pack or a heating pad. Place a towel between your skin and the heat source. Leave the heat on for 20-30 minutes. Remove the heat if your skin turns bright red. This is especially  important if you are unable to feel pain, heat, or cold. You have a greater risk of getting burned. Eating and drinking Eat meals on a regular schedule. If you drink alcohol: Limit how much you have to: 0-1 drink a day for women who are not pregnant. 0-2 drinks a day for men. Know how much alcohol is in a drink. In the [place] one drink equals one 12 oz bottle of beer (355 mL), one 5 oz glass of wine (148 mL), or one 1 oz glass of hard liquor (44 mL). Stop drinking caffeine, or decrease the amount of caffeine you drink. Drink enough fluid to keep your urine pale yellow. General instructions  Keep a headache journal to help find out what may trigger your headaches. For example, write down: What you eat and drink. How much sleep you get. Any change to your diet or medicines. Try massage or other relaxation techniques. Limit stress. Sit up straight, and do not tense your muscles. Do not use any products that contain nicotine or tobacco. These products include cigarettes, chewing tobacco, and vaping devices, such as e-cigarettes. If you need help quitting, ask your health care provider. Exercise regularly as told by your health care provider. Sleep on a regular schedule. Get 7-9 hours of sleep each night, or the amount recommended by your health care provider. Keep all follow-up visits. This is important. Contact a health care provider if: Medicine does not help your symptoms. You have a headache that is different from your usual  headache. You have nausea or you vomit. You have a fever. Get help right away if: Your headache: Becomes severe quickly. Gets worse after moderate to intense physical activity. You have any of these symptoms: Repeated vomiting. Pain or stiffness in your neck. Changes to your vision. Pain in an eye or ear. Problems with speech. Muscular weakness or loss of muscle control. Loss of balance or coordination. You feel faint or pass out. You have confusion. You  have a seizure. These symptoms may represent a serious problem that is an emergency. Do not wait to see if the symptoms will go away. Get medical help right away. Call your local emergency services (911 in the [place] Do not drive yourself to the hospital. Summary A headache is pain or discomfort felt around the head or neck area. There are many causes and types of headaches. In some cases, the cause may not be found. Keep a headache journal to help find out what may trigger your headaches. Watch your condition for any changes. Let your health care provider know about them. Contact a health care provider if you have a headache that is different from the usual headache, or if your symptoms are not helped by medicine. Get help right away if your headache becomes severe, you vomit, you have a loss of vision, you lose your balance, or you have a seizure. This information is not intended to replace advice given to you by your health care provider. Make sure you discuss any questions you have with your health care provider. Document Revised: 05/17/2021 Document Reviewed: 05/17/2021 Elsevier Patient Education  2024 [employer].

## 2023-12-04 LAB — MICROALBUMIN / CREATININE URINE RATIO
Creatinine,U: 86.4 mg/dL
Microalb Creat Ratio: 0.8 mg/g (ref 0.0–30.0)
Microalb, Ur: <0.7 mg/dL (ref 0.0–1.9)

## 2023-12-04 LAB — COMPREHENSIVE METABOLIC PANEL
ALT: 12 U/L (ref 0–35)
AST: 17 U/L (ref 0–37)
Albumin: 4.6 g/dL (ref 3.5–5.2)
Alkaline Phosphatase: 69 U/L (ref 39–117)
BUN: 24 mg/dL — ABNORMAL HIGH (ref 6–23)
CO2: 30 meq/L (ref 19–32)
Calcium: 9.8 mg/dL (ref 8.4–10.5)
Chloride: 98 meq/L (ref 96–112)
Creatinine, Ser: 0.86 mg/dL (ref 0.40–1.20)
GFR: 67.5 mL/min (ref >60.00)
Glucose, Bld: 139 mg/dL — ABNORMAL HIGH (ref 70–99)
Potassium: 4.7 meq/L (ref 3.5–5.1)
Sodium: 140 meq/L (ref 135–145)
Total Bilirubin: 0.4 mg/dL (ref 0.2–1.2)
Total Protein: 7.7 g/dL (ref 6.0–8.3)

## 2023-12-04 LAB — CBC WITH DIFFERENTIAL/PLATELET
Basophils Absolute: 0 10*3/uL (ref 0.0–0.1)
Basophils Relative: 0.6 % (ref 0.0–3.0)
Eosinophils Absolute: 0.1 10*3/uL (ref 0.0–0.7)
Eosinophils Relative: 1.8 % (ref 0.0–5.0)
HCT: 41 % (ref 36.0–46.0)
Hemoglobin: 13.8 g/dL (ref 12.0–15.0)
Lymphocytes Relative: 32 % (ref 12.0–46.0)
Lymphs Abs: 1.9 10*3/uL (ref 0.7–4.0)
MCHC: 33.7 g/dL (ref 30.0–36.0)
MCV: 91.7 fL (ref 78.0–100.0)
Monocytes Absolute: 0.5 10*3/uL (ref 0.1–1.0)
Monocytes Relative: 8.5 % (ref 3.0–12.0)
Neutro Abs: 3.4 10*3/uL (ref 1.4–7.7)
Neutrophils Relative %: 57.1 % (ref 43.0–77.0)
Platelets: 211 10*3/uL (ref 150.0–400.0)
RBC: 4.48 Mil/uL (ref 3.87–5.11)
RDW: 14 % (ref 11.5–15.5)
WBC: 6 10*3/uL (ref 4.0–10.5)

## 2023-12-04 LAB — LIPID PANEL
Cholesterol: 256 mg/dL — ABNORMAL HIGH (ref 0–200)
HDL: 59.3 mg/dL (ref >39.00)
Total CHOL/HDL Ratio: 4
Triglycerides: 438 mg/dL — ABNORMAL HIGH (ref 0.0–149.0)

## 2023-12-04 LAB — TSH: TSH: 4.7 u[IU]/mL (ref 0.35–5.50)

## 2023-12-04 LAB — LDL CHOLESTEROL, DIRECT: Direct LDL: 135 mg/dL

## 2023-12-04 LAB — HEMOGLOBIN A1C: Hgb A1c MFr Bld: 8 % — ABNORMAL HIGH (ref 4.6–6.5)

## 2023-12-04 NOTE — Progress Notes (Signed)
Subjective:    Patient ID: Jasmine Morse, female    DOB: August 20, 1951, 72 y.o.   MRN: [id number]  Chief Complaint  Patient presents with  . Follow-up    6 month    HPI Discussed the use of AI scribe software for clinical note transcription with the patient, who gave verbal consent to proceed.  History of Present Illness   The patient, with a history of sinus allergies, presents with frequent headaches, occurring at least a couple of times a week. The headaches are described as a tightness in the forehead, which the patient attributes to sinus pressure. The patient reports that these headaches often lead to a feeling of dizziness, to the point where they avoid driving or going out. The patient also mentions occasional nosebleeds and a recent cold. Over-the-counter medication for headaches and sinus pressure provides some relief. The patient also has a family history of allergies and a sister who is showing signs of early dementia.        Past Medical History:  Diagnosis Date  . Allergic state 08/10/2014  . Cancer (HCC)    colon cancer stage III (T3, N2b) s/p right hemicolectomy, FOLFOX-- Dr Myrle Sheng  . Diabetes mellitus    type II  . Hair loss 07/10/2015  . Obesity, unspecified 08/10/2014  . Preventative health care 09/09/2017  . Thyroid disease    hypothyroidism    Past Surgical History:  Procedure Laterality Date  . CESAREAN SECTION     x 3  . INNER EAR SURGERY     due to numerous ear infections  . TONSILLECTOMY      Family History  Problem Relation Age of Onset  . Hypertension Son   . Cancer Father        colon  . Cancer Brother        colon  . Diabetes Other   . Hypertension Other   . Stroke Other     Social History   Socioeconomic History  . Marital status: Widowed    Spouse name: Not on file  . Number of children: 3  . Years of education: Not on file  . Highest education level: Not on file  Occupational History  . Occupation: [employer]    Employer:  [employer]  Tobacco Use  . Smoking status: Never  . Smokeless tobacco: Never  Substance and Sexual Activity  . Alcohol use: No  . Drug use: No  . Sexual activity: Yes  Other Topics Concern  . Not on file  Social History Narrative  . Not on file   Social Determinants of Health   Financial Resource Strain: Not on file  Food Insecurity: Not on file  Transportation Needs: Not on file  Physical Activity: Not on file  Stress: Not on file  Social Connections: Not on file  Intimate Partner Violence: Not on file    Outpatient Medications Prior to Visit  Medication Sig Dispense Refill  . atorvastatin (LIPITOR) 20 MG tablet Take 1 tablet (20 mg total) by mouth daily. for cholesterol. 90 tablet 1  . Blood Glucose Monitoring Suppl (ACCU-CHEK GUIDE) w/Device KIT 1 kit by Does not apply route as directed. 1 kit 0  . Ez Smart Blood Glucose Lancets MISC 1 strip by Does not apply route daily. 100 each 12  . glucose blood test strip Use as instructed 100 each 12  . levothyroxine (SYNTHROID) 88 MCG tablet TAKE 1 TABLET BY MOUTH ONCE DAILY BEFOREBREAKFAST 90 tablet 1  .  lisinopril (ZESTRIL) 5 MG tablet Take 1 tablet (5 mg total) by mouth daily. 90 tablet 1  . sitaGLIPtin (JANUVIA) 100 MG tablet Take 1 tablet (100 mg total) by mouth daily. 90 tablet 1   No facility-administered medications prior to visit.    Allergies  Allergen Reactions  . Codeine     REACTION: Nausea \\T \ Diarrhea    Review of Systems  Constitutional:  Negative for fever and malaise/fatigue.  HENT:  Negative for congestion.   Eyes:  Negative for blurred vision.  Respiratory:  Negative for shortness of breath.   Cardiovascular:  Negative for chest pain, palpitations and leg swelling.  Gastrointestinal:  Negative for abdominal pain, blood in stool and nausea.  Genitourinary:  Negative for dysuria and frequency.  Musculoskeletal:  Negative for falls.  Skin:  Negative for rash.  Neurological:  Positive for  headaches. Negative for dizziness and loss of consciousness.  Endo/Heme/Allergies:  Negative for environmental allergies.  Psychiatric/Behavioral:  Negative for depression. The patient is not nervous/anxious.       Objective:    Physical Exam Constitutional:      General: She is not in acute distress.    Appearance: Normal appearance. She is well-developed. She is not toxic-appearing.  HENT:     Head: Normocephalic and atraumatic.     Right Ear: External ear normal.     Left Ear: External ear normal.     Nose: Nose normal.  Eyes:     General:        Right eye: No discharge.        Left eye: No discharge.     Conjunctiva/sclera: Conjunctivae normal.  Neck:     Thyroid: No thyromegaly.  Cardiovascular:     Rate and Rhythm: Normal rate and regular rhythm.     Heart sounds: Normal heart sounds. No murmur heard. Pulmonary:     Effort: Pulmonary effort is normal. No respiratory distress.     Breath sounds: Normal breath sounds.  Abdominal:     General: Bowel sounds are normal.     Palpations: Abdomen is soft.     Tenderness: There is no abdominal tenderness. There is no guarding.  Musculoskeletal:        General: Normal range of motion.     Cervical back: Neck supple.  Lymphadenopathy:     Cervical: No cervical adenopathy.  Skin:    General: Skin is warm and dry.  Neurological:     Mental Status: She is alert and oriented to person, place, and time.  Psychiatric:        Mood and Affect: Mood normal.        Behavior: Behavior normal.        Thought Content: Thought content normal.        Judgment: Judgment normal.   BP 122/82 (BP Location: Left Arm, Patient Position: Sitting, Cuff Size: Large)   Pulse 77   Temp 97.6 F (36.4 C) (Oral)   Resp 18   Ht 5\' 2"  (1.575 m)   Wt 193 lb 12.8 oz (87.9 kg)   SpO2 98%   BMI 35.45 kg/m  Wt Readings from Last 3 Encounters:  12/03/23 193 lb 12.8 oz (87.9 kg)  04/02/23 192 lb (87.1 kg)  02/07/22 190 lb (86.2 kg)    Diabetic  Foot Exam - Simple   No data filed    Lab Results  Component Value Date   WBC 6.0 12/03/2023   HGB 13.8 12/03/2023   HCT 41.0 12/03/2023  PLT 211.0 12/03/2023   GLUCOSE 139 (H) 12/03/2023   CHOL 256 (H) 12/03/2023   TRIG (H) 12/03/2023    438.0 Triglyceride is over 400; calculations on Lipids are invalid.   HDL 59.30 12/03/2023   LDLDIRECT 135.0 12/03/2023   LDLCALC 85 04/02/2023   ALT 12 12/03/2023   AST 17 12/03/2023   NA 140 12/03/2023   K 4.7 12/03/2023   CL 98 12/03/2023   CREATININE 0.86 12/03/2023   BUN 24 (H) 12/03/2023   CO2 30 12/03/2023   TSH 4.70 12/03/2023   HGBA1C 8.0 (H) 12/03/2023   MICROALBUR <0.7 12/03/2023    Lab Results  Component Value Date   TSH 4.70 12/03/2023   Lab Results  Component Value Date   WBC 6.0 12/03/2023   HGB 13.8 12/03/2023   HCT 41.0 12/03/2023   MCV 91.7 12/03/2023   PLT 211.0 12/03/2023   Lab Results  Component Value Date   NA 140 12/03/2023   K 4.7 12/03/2023   CO2 30 12/03/2023   GLUCOSE 139 (H) 12/03/2023   BUN 24 (H) 12/03/2023   CREATININE 0.86 12/03/2023   BILITOT 0.4 12/03/2023   ALKPHOS 69 12/03/2023   AST 17 12/03/2023   ALT 12 12/03/2023   PROT 7.7 12/03/2023   ALBUMIN 4.6 12/03/2023   CALCIUM 9.8 12/03/2023   ANIONGAP 9 03/02/2019   EGFR 80 04/02/2023   GFR 67.50 12/03/2023   Lab Results  Component Value Date   CHOL 256 (H) 12/03/2023   Lab Results  Component Value Date   HDL 59.30 12/03/2023   Lab Results  Component Value Date   LDLCALC 85 04/02/2023   Lab Results  Component Value Date   TRIG (H) 12/03/2023    438.0 Triglyceride is over 400; calculations on Lipids are invalid.   Lab Results  Component Value Date   CHOLHDL 4 12/03/2023   Lab Results  Component Value Date   HGBA1C 8.0 (H) 12/03/2023       Assessment & Plan:  Hyperlipidemia, unspecified hyperlipidemia type Assessment & Plan: She has been off ov the Atorvastatin for several months but she agrees to restart it.  Encourage heart healthy diet such as MIND or DASH diet, increase exercise, avoid trans fats, simple carbohydrates and processed foods, consider a krill or fish or flaxseed oil cap daily.   Orders: -     Lipid panel  Hypothyroidism, unspecified type Assessment & Plan: On Levothyroxine, continue to monitor  Orders: -     TSH  Type 2 diabetes mellitus with obesity (HCC) Assessment & Plan: hgba1c acceptable, minimize simple carbs. Increase exercise as tolerated. Continue current meds   Orders: -     Comprehensive metabolic panel -     Hemoglobin A1c -     Microalbumin / creatinine urine ratio  Headache disorder -     CBC with Differential/Platelet  Other orders -     LDL cholesterol, direct    Assessment and Plan    Sinus Headaches Frequent sinus headaches, possibly related to allergies. Over-the-counter medication provides some relief. No associated photophobia, phonophobia, or significant nausea. -Consider daily allergy medications such as loratadine or cetirizine, and a nasal spray like Flonase for one month to assess for improvement in headache frequency and severity. -Encouraged to bring in over-the-counter medication for review at next visit.  Epistaxis Recent episode of mild epistaxis, possibly related to dry nasal mucosa from allergies. -Continue current management as needed.  General Health Maintenance Discussed various vaccinations including Prevnar 20,  annual flu and COVID vaccines, Shingrix, and Orexvy for RSV. -Consider obtaining these vaccinations at the pharmacy as per patient's comfort and convenience.  Follow-up -Order routine blood work. -Schedule follow-up visit in approximately six months, or sooner if any concerns arise.         Danise Edge, MD

## 2023-12-13 ENCOUNTER — Encounter: Payer: Self-pay | Admitting: Emergency Medicine

## 2024-01-30 ENCOUNTER — Telehealth: Payer: Self-pay | Admitting: Neurology

## 2024-01-30 DIAGNOSIS — E039 Hypothyroidism, unspecified: Secondary | ICD-10-CM

## 2024-01-30 DIAGNOSIS — E785 Hyperlipidemia, unspecified: Secondary | ICD-10-CM

## 2024-01-30 DIAGNOSIS — E038 Other specified hypothyroidism: Secondary | ICD-10-CM

## 2024-01-30 DIAGNOSIS — T7840XA Allergy, unspecified, initial encounter: Secondary | ICD-10-CM

## 2024-01-30 MED ORDER — LISINOPRIL 5 MG PO TABS: 5.0000 mg | ORAL_TABLET | Freq: Every day | ORAL | 1 refills | Status: DC

## 2024-01-30 MED ORDER — LEVOTHYROXINE SODIUM 88 MCG PO TABS: 88.0000 ug | ORAL_TABLET | Freq: Every day | ORAL | 1 refills | Status: DC

## 2024-01-30 MED ORDER — ATORVASTATIN CALCIUM 20 MG PO TABS: 20.0000 mg | ORAL_TABLET | Freq: Every day | ORAL | 1 refills | Status: DC

## 2024-01-30 MED ORDER — SITAGLIPTIN PHOSPHATE 100 MG PO TABS: 100.0000 mg | ORAL_TABLET | Freq: Every day | ORAL | 1 refills | Status: DC

## 2024-01-30 NOTE — Telephone Encounter (Signed)
Prescriptions sent to pharmacy.    Copied from CRM [id number]. Topic: Clinical - Medication Refill >> Jan 30, 2024  3:17 PM Tiffany H wrote: Most Recent Primary Care Visit:  Provider: Danise Edge A  Department: [facility]  Visit Type: OFFICE VISIT  Date: 12/03/2023  Medication:   atorvastatin (LIPITOR) 20 MG tablet  levothyroxine (SYNTHROID) 88 MCG tablet  lisinopril (ZESTRIL) 5 MG tablet - OUT.  sitaGLIPtin (JANUVIA) 100 MG tablet - OUT.   Has the patient contacted their pharmacy? Yes. Patient was sent back to Dr. Abner Greenspan.   (Agent: If no, request that the patient contact the pharmacy for the refill. If patient does not wish to contact the pharmacy document the reason why and proceed with request.) (Agent: If yes, when and what did the pharmacy advise?)  Is this the correct pharmacy for this prescription? Yes If no, delete pharmacy and type the correct one.  This is the patient's preferred pharmacy:   [id number] - [place], [place] - [address] [place] [address] Phone: [phone] Fax: [phone]   Has the prescription been filled recently? No  Is the patient out of the medication? Yes  Has the patient been seen for an appointment in the last year OR does the patient have an upcoming appointment? Yes  Can we respond through MyChart? No  Agent: Please be advised that Rx refills may take up to 3 business days. We ask that you follow-up with your pharmacy.

## 2024-01-31 ENCOUNTER — Other Ambulatory Visit: Payer: Self-pay | Admitting: Neurology

## 2024-01-31 DIAGNOSIS — E785 Hyperlipidemia, unspecified: Secondary | ICD-10-CM

## 2024-01-31 DIAGNOSIS — T7840XA Allergy, unspecified, initial encounter: Secondary | ICD-10-CM

## 2024-01-31 DIAGNOSIS — E038 Other specified hypothyroidism: Secondary | ICD-10-CM

## 2024-01-31 DIAGNOSIS — E039 Hypothyroidism, unspecified: Secondary | ICD-10-CM

## 2024-01-31 MED ORDER — LEVOTHYROXINE SODIUM 88 MCG PO TABS: 88.0000 ug | ORAL_TABLET | Freq: Every day | ORAL | 1 refills | Status: AC

## 2024-01-31 MED ORDER — SITAGLIPTIN PHOSPHATE 100 MG PO TABS: 100.0000 mg | ORAL_TABLET | Freq: Every day | ORAL | 1 refills | Status: AC

## 2024-01-31 MED ORDER — ATORVASTATIN CALCIUM 20 MG PO TABS: 20.0000 mg | ORAL_TABLET | Freq: Every day | ORAL | 1 refills | Status: AC

## 2024-01-31 MED ORDER — LISINOPRIL 5 MG PO TABS: 5.0000 mg | ORAL_TABLET | Freq: Every day | ORAL | 1 refills | Status: AC

## 2024-01-31 NOTE — Telephone Encounter (Signed)
Prescriptions resent.   Copied from CRM [id number]. Topic: Clinical - Prescription Issue >> Jan 31, 2024  2:47 PM Isabell A wrote: Reason for CRM: Patient states all her medications have been sent to the wrong pharmacy, the correct pharmacy is:   [id number] - [place], [place] - [address] [place] [address] Phone: [phone] Fax: [phone]

## 2024-02-04 ENCOUNTER — Telehealth: Payer: Self-pay

## 2024-02-04 NOTE — Telephone Encounter (Signed)
 Copied from CRM [id number]. Topic: Clinical - Medication Question >> Feb 04, 2024  2:43 PM Franky GRADE wrote: Reason for CRM: Patient is upset because her prescriptions for lisinopril  (ZESTRIL ) 5 MG tablet, sitaGLIPtin  (JANUVIA ) 100 MG tablet,levothyroxine  (SYNTHROID ) 88 MCG tablet, atorvastatin  (LIPITOR) 20 MG tablet were sent to the incorrect pharmacy, she would like for them to be resent to [id number] - [place], [place] - [address]  [address]  [place] [address]  Phone: [phone] Fax: [phone]

## 2024-02-05 NOTE — Telephone Encounter (Signed)
 Spoke with patient to see which pharmacy her prescriptions were sent to. Called [facility] in [place]. Spoke with Aarron and he [facility] [place] [facility] [address].

## 2024-05-06 DIAGNOSIS — R059 Cough, unspecified: Secondary | ICD-10-CM | POA: Diagnosis not present

## 2024-05-06 DIAGNOSIS — R519 Headache, unspecified: Secondary | ICD-10-CM | POA: Diagnosis not present

## 2024-05-06 DIAGNOSIS — J101 Influenza due to other identified influenza virus with other respiratory manifestations: Secondary | ICD-10-CM | POA: Diagnosis not present

## 2024-05-06 DIAGNOSIS — I10 Essential (primary) hypertension: Secondary | ICD-10-CM | POA: Diagnosis not present

## 2024-05-06 DIAGNOSIS — E6609 Other obesity due to excess calories: Secondary | ICD-10-CM | POA: Diagnosis not present

## 2024-05-22 DIAGNOSIS — R0602 Shortness of breath: Secondary | ICD-10-CM | POA: Diagnosis not present

## 2024-05-22 DIAGNOSIS — R739 Hyperglycemia, unspecified: Secondary | ICD-10-CM | POA: Diagnosis not present

## 2024-05-22 DIAGNOSIS — J984 Other disorders of lung: Secondary | ICD-10-CM | POA: Diagnosis not present

## 2024-05-22 DIAGNOSIS — R590 Localized enlarged lymph nodes: Secondary | ICD-10-CM | POA: Diagnosis not present

## 2024-05-22 DIAGNOSIS — R918 Other nonspecific abnormal finding of lung field: Secondary | ICD-10-CM | POA: Diagnosis not present

## 2024-05-22 DIAGNOSIS — R55 Syncope and collapse: Secondary | ICD-10-CM | POA: Diagnosis not present

## 2024-05-22 DIAGNOSIS — J189 Pneumonia, unspecified organism: Secondary | ICD-10-CM | POA: Diagnosis not present

## 2024-05-22 DIAGNOSIS — J159 Unspecified bacterial pneumonia: Secondary | ICD-10-CM | POA: Diagnosis not present

## 2024-05-22 DIAGNOSIS — I10 Essential (primary) hypertension: Secondary | ICD-10-CM | POA: Diagnosis not present

## 2024-05-28 ENCOUNTER — Ambulatory Visit: Admitting: Family Medicine

## 2024-05-28 ENCOUNTER — Encounter: Payer: Self-pay | Admitting: Family Medicine

## 2024-05-28 VITALS — BP 139/58 | HR 65 | Temp 97.5°F | Ht 62.0 in | Wt 190.0 lb

## 2024-05-28 DIAGNOSIS — J189 Pneumonia, unspecified organism: Secondary | ICD-10-CM

## 2024-05-28 DIAGNOSIS — E119 Type 2 diabetes mellitus without complications: Secondary | ICD-10-CM

## 2024-05-28 NOTE — Progress Notes (Signed)
 Acute Office Visit  Subjective:     Patient ID: Jasmine Morse, female    DOB: 31-Jan-1951, 73 y.o.   MRN: [id number]  Chief Complaint  Patient presents with   Hospitalization Follow-up    HPI Patient is in today for hospital/ED follow-up.   Discussed the use of AI scribe software for clinical note transcription with the patient, who gave verbal consent to proceed.  History of Present Illness Jasmine Morse is a 73 year old female who presents with shortness of breath and recent pneumonia diagnosis.  She experienced an episode at work characterized by an urgent need to use the bathroom, followed by sudden shortness of breath and a sensation of needing air. She also felt flushed and sweaty, prompting her to leave the bathroom quickly. A colleague advised her to sit down, and emergency services were called due to concerns of a possible heart attack.  Approximately three weeks prior, she had flu-like symptoms, including sinus and allergy issues, which she attributes to her smaller nasal passages as diagnosed by a doctor years ago. During these episodes, she often experiences imbalance rather than dizziness.  In the emergency room, she underwent various tests, including blood work and imaging, which ruled out a PE. She was diagnosed with pneumonia in the left upper lobe and was discharged with a prescription for oral antibiotics, doxycycline and Augmentin. Since starting the antibiotics, she has not experienced any further episodes of shortness of breath and reports feeling better overall. She is eating and drinking well.  Her magnesium levels were low, and she received a single dose of magnesium supplementation.  She is currently taking Januvia  for diabetes management. Her blood sugar was elevated to the 300s during the emergency department visit, and her last A1c in December was 8.0%. She has a follow-up appointment scheduled for July 17th.  No further episodes of shortness of breath since  the initial incident. She reports a current headache and nasal congestion, which she attributes to poor air circulation in the room.             ROS All review of systems negative except what is listed in the HPI      Objective:    BP (!) 139/58   Pulse 65   Temp (!) 97.5 F (36.4 C) (Oral)   Ht 5\' 2"  (1.575 m)   Wt 190 lb (86.2 kg)   SpO2 100%   BMI 34.75 kg/m    Physical Exam Vitals reviewed.  Constitutional:      Appearance: Normal appearance.  Cardiovascular:     Rate and Rhythm: Normal rate and regular rhythm.  Pulmonary:     Effort: Pulmonary effort is normal.     Breath sounds: Normal breath sounds. No wheezing, rhonchi or rales.  Skin:    General: Skin is warm and dry.  Neurological:     Mental Status: She is alert and oriented to person, place, and time.  Psychiatric:        Mood and Affect: Mood normal.        Behavior: Behavior normal.        Thought Content: Thought content normal.        Judgment: Judgment normal.     No results found for any visits on 05/28/24.      Assessment & Plan:   Problem List Items Addressed This Visit       Active Problems   Type 2 diabetes mellitus without complication, without long-term current use of  insulin  (HCC)   Other Visit Diagnoses       Pneumonia of left upper lobe due to infectious organism    -  Primary   Relevant Medications   amoxicillin-clavulanate (AUGMENTIN) 875-125 MG tablet   doxycycline (VIBRA-TABS) 100 MG tablet   Other Relevant Orders   DG Chest 2 View   Basic metabolic panel with GFR   Magnesium     Hypomagnesemia       Relevant Orders   Basic metabolic panel with GFR   Magnesium       Assessment & Plan Left upper lobe pneumonia ED at [facility] 05/22/24. Discharged with stable vitals. Currently on antibiotics with stable breathing. - Continue doxycycline and augmentin for entire course. - Order follow-up chest x-ray in 3-4 weeks. - Seek medical attention if symptoms  worsen. - Repeat magnesium today - oral replacement in the ED.  Type 2 diabetes mellitus without complication Elevated glucose levels during recent illness. A1c was 8% in December. Currently on Januvia . Discussed injectable medications, declined. Emphasized monitoring during illness. - Monitor blood glucose levels closely, especially during illness. - Recheck A1c at next follow-up appointment. - Continue Januvia  100 mg orally daily. - Discuss potential medication adjustments at next follow-up if A1c remains elevated.     No orders of the defined types were placed in this encounter.   Return for keep upcoming PCP follow-up appt.  Everlina Hock, NP

## 2024-05-29 ENCOUNTER — Ambulatory Visit: Payer: Self-pay | Admitting: Family Medicine

## 2024-05-29 LAB — BASIC METABOLIC PANEL WITH GFR
BUN: 11 mg/dL (ref 6–23)
CO2: 26 meq/L (ref 19–32)
Calcium: 9.7 mg/dL (ref 8.4–10.5)
Chloride: 99 meq/L (ref 96–112)
Creatinine, Ser: 0.77 mg/dL (ref 0.40–1.20)
GFR: 76.81 mL/min (ref >60.00)
Glucose, Bld: 207 mg/dL — ABNORMAL HIGH (ref 70–99)
Potassium: 4.6 meq/L (ref 3.5–5.1)
Sodium: 139 meq/L (ref 135–145)

## 2024-05-29 LAB — MAGNESIUM: Magnesium: 1.7 mg/dL (ref 1.5–2.5)

## 2024-07-13 NOTE — Assessment & Plan Note (Deleted)
 hgba1c acceptable, minimize simple carbs. Increase exercise as tolerated. Continue current meds

## 2024-07-13 NOTE — Assessment & Plan Note (Deleted)
 Encouraged DASH or MIND diet, decrease po intake and increase exercise as tolerated. Needs 7-8 hours of sleep nightly. Avoid trans fats, eat small, frequent meals every 4-5 hours with lean proteins, complex carbs and healthy fats. Minimize simple carbs, high fat foods and processed foods

## 2024-07-13 NOTE — Assessment & Plan Note (Deleted)
 On Levothyroxine, continue to monitor

## 2024-07-13 NOTE — Assessment & Plan Note (Deleted)
 Patient encouraged to maintain heart healthy diet, regular exercise, adequate sleep. Consider daily probiotics. Take medications as prescribed. Given and reviewed copy of ACP documents from [employer] and encouraged to complete and return. Check labs today, reviewed old labs. Colonoscopy 2013 Dexa 01/2022 [facility] Pap Given and reviewed copy of ACP documents from [facility] and encouraged to complete and return

## 2024-07-13 NOTE — Assessment & Plan Note (Deleted)
She has been off ov the Atorvastatin for several months but she agrees to restart it. Encourage heart healthy diet such as MIND or DASH diet, increase exercise, avoid trans fats, simple carbohydrates and processed foods, consider a krill or fish or flaxseed oil cap daily.

## 2024-07-16 ENCOUNTER — Encounter: Payer: Medicare PPO | Admitting: Family Medicine

## 2024-07-16 DIAGNOSIS — E039 Hypothyroidism, unspecified: Secondary | ICD-10-CM

## 2024-07-16 DIAGNOSIS — E785 Hyperlipidemia, unspecified: Secondary | ICD-10-CM

## 2024-07-16 DIAGNOSIS — Z0000 Encounter for general adult medical examination without abnormal findings: Secondary | ICD-10-CM

## 2024-07-16 DIAGNOSIS — E669 Obesity, unspecified: Secondary | ICD-10-CM

## 2024-07-16 DIAGNOSIS — E6609 Other obesity due to excess calories: Secondary | ICD-10-CM

## 2024-09-30 NOTE — Progress Notes (Signed)
 Jasmine Morse                                          MRN: [id number]   09/30/2024   The [facility] Quality Team Specialist reviewed this patient medical record for the purposes of chart review for care gap closure. The following were reviewed: chart review for care gap closure-kidney health evaluation for diabetes:eGFR  and uACR.    [facility]BCI Quality Team

## 2024-10-06 ENCOUNTER — Emergency Department (HOSPITAL_COMMUNITY)
Admission: EM | Admit: 2024-10-06 | Discharge: 2024-10-06 | Disposition: A | Discharge status: Home / Self Care | Attending: Emergency Medicine | Admitting: Emergency Medicine

## 2024-10-06 ENCOUNTER — Emergency Department (HOSPITAL_COMMUNITY)

## 2024-10-06 ENCOUNTER — Other Ambulatory Visit: Payer: Self-pay

## 2024-10-06 DIAGNOSIS — Z85038 Personal history of other malignant neoplasm of large intestine: Secondary | ICD-10-CM | POA: Insufficient documentation

## 2024-10-06 DIAGNOSIS — R0789 Other chest pain: Secondary | ICD-10-CM | POA: Insufficient documentation

## 2024-10-06 DIAGNOSIS — J811 Chronic pulmonary edema: Secondary | ICD-10-CM | POA: Diagnosis not present

## 2024-10-06 DIAGNOSIS — E039 Hypothyroidism, unspecified: Secondary | ICD-10-CM | POA: Insufficient documentation

## 2024-10-06 DIAGNOSIS — E119 Type 2 diabetes mellitus without complications: Secondary | ICD-10-CM | POA: Insufficient documentation

## 2024-10-06 DIAGNOSIS — K219 Gastro-esophageal reflux disease without esophagitis: Secondary | ICD-10-CM | POA: Diagnosis not present

## 2024-10-06 DIAGNOSIS — I10 Essential (primary) hypertension: Secondary | ICD-10-CM | POA: Insufficient documentation

## 2024-10-06 DIAGNOSIS — R079 Chest pain, unspecified: Secondary | ICD-10-CM

## 2024-10-06 DIAGNOSIS — K8020 Calculus of gallbladder without cholecystitis without obstruction: Secondary | ICD-10-CM | POA: Diagnosis not present

## 2024-10-06 LAB — TROPONIN I (HIGH SENSITIVITY)
Troponin I (High Sensitivity): 4 ng/L (ref <18)
Troponin I (High Sensitivity): 3 ng/L (ref <18)

## 2024-10-06 LAB — CBC
HCT: 39.9 % (ref 36.0–46.0)
Hemoglobin: 13 g/dL (ref 12.0–15.0)
MCH: 30.4 pg (ref 26.0–34.0)
MCHC: 32.6 g/dL (ref 30.0–36.0)
MCV: 93.2 fL (ref 80.0–100.0)
Platelets: 197 K/uL (ref 150–400)
RBC: 4.28 MIL/uL (ref 3.87–5.11)
RDW: 12.8 % (ref 11.5–15.5)
WBC: 9.8 K/uL (ref 4.0–10.5)
nRBC: 0 % (ref 0.0–0.2)

## 2024-10-06 LAB — BASIC METABOLIC PANEL WITH GFR
Anion gap: 13 (ref 5–15)
BUN: 14 mg/dL (ref 8–23)
CO2: 24 mmol/L (ref 22–32)
Calcium: 8.7 mg/dL — ABNORMAL LOW (ref 8.9–10.3)
Chloride: 101 mmol/L (ref 98–111)
Creatinine, Ser: 0.78 mg/dL (ref 0.44–1.00)
GFR, Estimated: >60 mL/min (ref >60)
Glucose, Bld: 158 mg/dL — ABNORMAL HIGH (ref 70–99)
Potassium: 4.1 mmol/L (ref 3.5–5.1)
Sodium: 138 mmol/L (ref 135–145)

## 2024-10-06 MED ORDER — IOHEXOL 350 MG/ML SOLN
75.0000 mL | Freq: Once | INTRAVENOUS | Status: AC | PRN
  Administered 2024-10-06: 75 mL via INTRAVENOUS

## 2024-10-06 MED ORDER — PANTOPRAZOLE SODIUM 40 MG PO TBEC
40.0000 mg | DELAYED_RELEASE_TABLET | Freq: Every day | ORAL | Status: DC
  Administered 2024-10-06: 40 mg via ORAL
  Filled 2024-10-06: qty 1

## 2024-10-06 MED ORDER — ALUM & MAG HYDROXIDE-SIMETH 200-200-20 MG/5ML PO SUSP
15.0000 mL | Freq: Once | ORAL | Status: AC
  Administered 2024-10-06: 15 mL via ORAL
  Filled 2024-10-06: qty 30

## 2024-10-06 MED ORDER — PANTOPRAZOLE SODIUM 20 MG PO TBEC: 20.0000 mg | DELAYED_RELEASE_TABLET | Freq: Every day | ORAL | 0 refills | Status: AC

## 2024-10-06 NOTE — ED Provider Triage Note (Signed)
 Emergency Medicine Provider Triage Evaluation Note  Jasmine Morse , a 73 y.o. female  was evaluated in triage.  Pt complains of pain in the middle of the chest with radiation to her back.  It began this morning.  Patient went to an urgent care who recommended she come here.  Patient says that she ate very late in the evening yesterday.  She continues to have pain.  She received aspirin and nitro from EMS.  Continue to have pain.  History of hypertension, diabetes and hyperlipidemia.  Review of Systems   Physical Exam  BP 138/62 (BP Location: Right Arm)   Pulse 69   Temp 98 F (36.7 C) (Oral)   Resp 18   Ht 5' 2 (1.575 m)   Wt 86.2 kg   SpO2 100%   BMI 34.76 kg/m  Gen:   Awake, no distress   Resp:  Normal effort  MSK:   Moves extremities without difficulty  Other:    Medical Decision Making  Medically screening exam initiated at 1:25 PM.  Appropriate orders placed.  [name] was informed that the remainder of the evaluation will be completed by another provider, this initial triage assessment does not replace that evaluation, and the importance of remaining in the ED until their evaluation is complete.  Initial EKG nonischemic, initial troponin negative.  Renal function normal.  Consider GERD, consider biliary colic, consider peptic ulcer disease, consider dissection.  Studies ordered.   Mannie Pac T, DO 10/06/24 1328

## 2024-10-06 NOTE — ED Triage Notes (Signed)
 PT was BIB EMS with chest pain this morning 8/10 central pain with radiation to back. Went to UC and was sent here. EKG WNL. Received 324 aspirin and nitroglycerin.  With pain improvement. Chest pain worse with ambulation. Bp 130/52 hr 70 sp02 97 ra.. cbg 118

## 2024-10-06 NOTE — Discharge Instructions (Signed)
 While you were in the emergency room, you have blood work done to look for signs of injury to your heart.  These test were normal.  You also do CT scan to look for signs of injury to your aorta.  Overall your blood work was normal.  I do think you could have some irritation of the lining of your stomach.  I have sent your prescription for medicine called Pantoprazole.  Please take that each day.  Please follow-up with your primary care doctor within 1 week.  Return to the emergency room if you develop worsening symptoms, worsening pain in your chest, difficulty with breathing or lose consciousness.

## 2024-10-06 NOTE — ED Notes (Signed)
 EKG added to the notes

## 2024-10-06 NOTE — ED Notes (Signed)
 Pt called x3 for repeat labs no answer at [facility]

## 2024-10-06 NOTE — ED Provider Notes (Signed)
 [place] [facility]PARTMENT AT [facility] Provider Note  CSN: [id number] Arrival date & time: 10/06/24 1105  Chief Complaint(s) Chest Pain  HPI Jasmine Morse is a 73 y.o. female Pt complains of pain in the middle of the chest with radiation to her back.  It began this morning.  Patient went to an urgent care who recommended she come here.  Patient says that she ate very late in the evening yesterday.  She continues to have pain.  She received aspirin and nitro from EMS.  Continue to have pain.  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 Authorizing Provider  atorvastatin  (LIPITOR) 20 MG tablet Take 1 tablet (20 mg total) by mouth daily. for cholesterol. 01/31/24   Domenica Harlene LABOR, MD  Blood Glucose Monitoring Suppl (ACCU-CHEK GUIDE) w/Device KIT 1 kit by Does not apply route as  directed. 12/06/20   Domenica Harlene LABOR, MD  Ez Smart Blood Glucose Lancets MISC 1 strip by Does not apply route daily. 12/06/20   Domenica Harlene LABOR, MD  glucose blood test strip Use as instructed 07/17/19   Domenica Harlene LABOR, MD  levothyroxine  (SYNTHROID ) 88 MCG tablet Take 1 tablet (88 mcg total) by mouth daily before breakfast. 01/31/24   Domenica Harlene LABOR, MD  lisinopril  (ZESTRIL ) 5 MG tablet Take 1 tablet (5 mg total) by mouth daily. 01/31/24   Domenica Harlene LABOR, MD  sitaGLIPtin  (JANUVIA ) 100 MG tablet Take 1 tablet (100 mg total) by mouth daily. 01/31/24   Domenica Harlene LABOR, MD                                                                                                                                    Past Surgical History Past Surgical History:  Procedure Laterality Date   CESAREAN SECTION  x 3   INNER EAR SURGERY     due to numerous ear infections   TONSILLECTOMY     Family History Family History  Problem Relation Age of Onset   Hypertension Son    Cancer Father        colon   Cancer Brother        colon   Diabetes Other    Hypertension Other    Stroke Other     Social History Social History   Tobacco Use   Smoking status: Never   Smokeless tobacco: Never  Substance Use Topics   Alcohol use: No   Drug use: No   Allergies Codeine  Review of Systems Review of Systems  Physical Exam Vital Signs  I have reviewed the triage vital signs BP 138/62 (BP Location: Right Arm)   Pulse 69   Temp 98 F (36.7 C) (Oral)   Resp 18   Ht 5' 2 (1.575 m)   Wt 86.2 kg   SpO2 100%   BMI 34.76 kg/m   Physical Exam Vitals and nursing note reviewed.  Constitutional:      Appearance: She is well-developed.  Cardiovascular:     Rate and Rhythm: Normal rate.     Heart sounds: Normal heart sounds.  Pulmonary:     Breath sounds: Normal breath sounds. No decreased breath sounds.  Chest:     Chest wall: No mass or tenderness.  Abdominal:     General: There is no abdominal bruit.      Palpations: Abdomen is soft. There is no hepatomegaly.  Musculoskeletal:        General: Normal range of motion.  Neurological:     Mental Status: She is alert.     ED Results and Treatments Labs (all labs ordered are listed, but only abnormal results are displayed) Labs Reviewed  BASIC METABOLIC PANEL WITH GFR - Abnormal; Notable for the following components:      Result Value   Glucose, Bld 158 (*)    Calcium  8.7 (*)    All other components within normal limits  CBC  TROPONIN I (HIGH SENSITIVITY)  TROPONIN I (HIGH SENSITIVITY)                                                                                                                          Radiology CT Angio Chest/Abd/Pel for Dissection W and/or W/WO Result Date: 10/06/2024 EXAM: CTA CHEST, ABDOMEN AND PELVIS WITHOUT AND WITH CONTRAST 10/06/2024 03:01:13 PM TECHNIQUE: CTA of the chest was performed without and with the administration of 75 mL of intravenous iohexol  (OMNIPAQUE ) 350 MG/ML injection. CTA of the abdomen and pelvis was performed without and with the administration of 75 mL of intravenous iohexol  (OMNIPAQUE ) 350 MG/ML injection. Multiplanar reformatted images are provided for review. MIP images are provided for review. Automated exposure control, iterative reconstruction, and/or weight based adjustment of the mA/kV was utilized to reduce the radiation dose to as low as reasonably achievable.  COMPARISON: CT chest 12/28/2011. CLINICAL HISTORY: Acute aortic syndrome (AAS) suspected. FINDINGS: VASCULATURE: AORTA: Noncontrast images demonstrate no intramural hematoma within the thoracic aorta. Contrast-enhanced imaging demonstrates no aortic dissection or aneurysm of the thoracic aorta. No abdominal aortic aneurysm. No dissection. PULMONARY ARTERIES: No pulmonary embolism with the limits of this exam. GREAT VESSELS OF AORTIC ARCH: Normal. No dissection. No arterial occlusion or significant stenosis. CELIAC TRUNK: No acute  finding. No occlusion or significant stenosis. SUPERIOR MESENTERIC ARTERY: No acute finding. No occlusion or significant stenosis. INFERIOR MESENTERIC ARTERY: No acute finding. No occlusion or significant stenosis. RENAL ARTERIES: No acute finding. No occlusion or significant stenosis. ILIAC ARTERIES: No acute finding. No occlusion or significant stenosis. CHEST: MEDIASTINUM: No mediastinal lymphadenopathy. The heart and pericardium demonstrate no acute abnormality. No pericardial fluid. LUNGS AND PLEURA: Mild ground-glass opacities are seen in the lungs. These opacities suggest mild pulmonary edema. No focal consolidation. No evidence of pleural effusion or pneumothorax. THORACIC BONES AND SOFT TISSUES: No acute bone or soft tissue abnormality. ABDOMEN AND PELVIS: LIVER: The liver is unremarkable. GALLBLADDER AND BILE DUCTS: Multiple gallstones without acute cystitis. No biliary ductal dilatation. SPLEEN: The spleen is unremarkable. PANCREAS: The pancreas is unremarkable. ADRENAL GLANDS: Bilateral adrenal glands demonstrate no acute abnormality. KIDNEYS, URETERS AND BLADDER: No stones in the kidneys or ureters. No hydronephrosis. No perinephric or periureteral stranding. Urinary bladder is unremarkable. GI AND BOWEL: Partial cecum resection with anastomosis was performed. Stomach and duodenal sweep demonstrate no acute abnormality. There is no bowel obstruction. No abnormal bowel wall thickening or distension. REPRODUCTIVE: 1.2 cm fat-containing lesion is noted within the uterus. The uterus appears normal. PERITONEUM AND RETROPERITONEUM: No ascites or free air. LYMPH NODES: No lymphadenopathy. ABDOMINAL BONES AND SOFT TISSUES: No acute abnormality of the bones. No acute soft tissue abnormality. IMPRESSION: 1. No evidence of acute aortic syndrome, including aortic dissection or aneurysm. 2. Mild pulmonary edema. Electronically signed by: Norleen Boxer MD 10/06/2024 03:43 PM EDT RP Workstation: [id number]   DG Chest  2 View Result Date: 10/06/2024 CLINICAL DATA:  Chest pain today. EXAM: CHEST - 2 VIEW COMPARISON:  May 22, 2024. FINDINGS: The heart size and mediastinal contours are within normal limits. Both lungs are clear. The visualized skeletal structures are unremarkable. IMPRESSION: No active cardiopulmonary disease. Electronically Signed   By: Lynwood Landy Raddle M.D.   On: 10/06/2024 13:05    Pertinent labs & imaging results that were available during my care of the patient were reviewed by me and considered in my medical decision making (see MDM for details).  Medications Ordered in ED Medications  pantoprazole (PROTONIX) EC tablet 40 mg (40 mg Oral Given 10/06/24 1414)  alum & mag hydroxide-simeth (MAALOX/MYLANTA) 200-200-20 MG/5ML suspension 15 mL (15 mLs Oral Given 10/06/24 1414)  iohexol  (OMNIPAQUE ) 350 MG/ML injection 75 mL (75 mLs Intravenous Contrast Given 10/06/24 1505)  Procedures Procedures  (including critical care time)  Medical Decision Making / ED Course   This patient presents to the ED for concern of epigastric pain and chest pain, this involves an extensive number of treatment options, and is a complaint that carries with it a high risk of complications and morbidity.  The differential diagnosis includes ACS, dissection, GERD, biliary colic, peptic ulcer disease.  MDM: Patient's EKG nonischemic.  Her CTA of her chest negative for dissection, central pulmonary embolism.  Troponin negative x 2.  Patient not having pain at this time.  Given her reassuring workup, believe patient is appropriate for outpatient management.  I believe her symptoms are likely GERD or peptic ulcer disease.  Will start patient on omeprazole, have her follow-up with her PCP.   Additional history obtained: -Additional history obtained from EMS -External records from outside source  obtained and reviewed including: Chart review including previous notes, labs, imaging, consultation notes   Lab Tests: -I ordered, reviewed, and interpreted labs.   The pertinent results include:   Labs Reviewed  BASIC METABOLIC PANEL WITH GFR - Abnormal; Notable for the following components:      Result Value   Glucose, Bld 158 (*)    Calcium  8.7 (*)    All other components within normal limits  CBC  TROPONIN I (HIGH SENSITIVITY)  TROPONIN I (HIGH SENSITIVITY)      EKG my independent review of the patient's EKG shows no ST segment depressions or elevations, no T wave inversions, no evidence of acute ischemia.  EKG Interpretation Date/Time:    Ventricular Rate:    PR Interval:    QRS Duration:    QT Interval:    QTC Calculation:   R Axis:      Text Interpretation:           Imaging Studies ordered: I ordered imaging studies including CTA chest, chest x-ray I independently visualized and interpreted imaging. I agree with the radiologist interpretation   Medicines ordered and prescription drug management: Meds ordered this encounter  Medications   alum & mag hydroxide-simeth (MAALOX/MYLANTA) 200-200-20 MG/5ML suspension 15 mL   pantoprazole (PROTONIX) EC tablet 40 mg   iohexol  (OMNIPAQUE ) 350 MG/ML injection 75 mL    -I have reviewed the patients home medicines and have made adjustments as needed   Cardiac Monitoring: The patient was maintained on a cardiac monitor.  I personally viewed and interpreted the cardiac monitored which showed an underlying rhythm of: Normal sinus rhythm  Social Determinants of Health:  Factors impacting patients care include: Lack of access to primary care   Reevaluation: After the interventions noted above, I reevaluated the patient and found that they have :improved  Co morbidities that complicate the patient evaluation  Past Medical History:  Diagnosis Date   Allergic state 08/10/2014   Cancer (HCC)    colon cancer stage  III (T3, N2b) s/p right hemicolectomy, FOLFOX-- Dr Deanne   Diabetes mellitus    type II   Hair loss 07/10/2015   Obesity, unspecified 08/10/2014   Preventative health care 09/09/2017   Thyroid  disease    hypothyroidism      Dispostion: I considered admission for this patient, however with her reassuring workup she is appropriate for discharge.     Final Clinical Impression(s) / ED Diagnoses Final diagnoses:  None     @PCDICTATION @    Mannie Pac T, DO 10/06/24 1613
